# Patient Record
Sex: Male | Born: 1990
Health system: Southern US, Community
[De-identification: ages and names within clinical notes are randomized; demographics above are authoritative.]

---
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
## 2019-10-28 ENCOUNTER — Other Ambulatory Visit: Payer: Self-pay

## 2019-10-28 DIAGNOSIS — Z20822 Contact with and (suspected) exposure to covid-19: Secondary | ICD-10-CM

## 2019-10-30 LAB — NOVEL CORONAVIRUS, NAA: SARS-CoV-2, NAA: NOT DETECTED

## 2019-10-30 LAB — SARS-COV-2, NAA 2 DAY TAT

## 2021-09-18 ENCOUNTER — Other Ambulatory Visit: Payer: Self-pay

## 2021-09-18 ENCOUNTER — Emergency Department: Payer: BC Managed Care – PPO

## 2021-09-18 ENCOUNTER — Observation Stay
Admission: EM | Admit: 2021-09-18 | Discharge: 2021-09-20 | Disposition: A | Discharge status: Home / Self Care | Payer: BC Managed Care – PPO | Attending: General Surgery | Admitting: General Surgery

## 2021-09-18 DIAGNOSIS — K353 Acute appendicitis with localized peritonitis, without perforation or gangrene: Secondary | ICD-10-CM | POA: Diagnosis not present

## 2021-09-18 DIAGNOSIS — R1031 Right lower quadrant pain: Secondary | ICD-10-CM | POA: Diagnosis present

## 2021-09-18 DIAGNOSIS — K3580 Unspecified acute appendicitis: Secondary | ICD-10-CM

## 2021-09-18 LAB — CBC
HCT: 43.4 % (ref 39.0–52.0)
Hemoglobin: 14 g/dL (ref 13.0–17.0)
MCH: 26.8 pg (ref 26.0–34.0)
MCHC: 32.3 g/dL (ref 30.0–36.0)
MCV: 83.1 fL (ref 80.0–100.0)
Platelets: 222 10*3/uL (ref 150–400)
RBC: 5.22 MIL/uL (ref 4.22–5.81)
RDW: 13.1 % (ref 11.5–15.5)
WBC: 6.3 10*3/uL (ref 4.0–10.5)
nRBC: 0 % (ref 0.0–0.2)

## 2021-09-18 LAB — URINALYSIS, ROUTINE W REFLEX MICROSCOPIC
Bilirubin Urine: NEGATIVE
Glucose, UA: NEGATIVE mg/dL
Hgb urine dipstick: NEGATIVE
Ketones, ur: NEGATIVE mg/dL
Leukocytes,Ua: NEGATIVE
Nitrite: NEGATIVE
Protein, ur: NEGATIVE mg/dL
Specific Gravity, Urine: 1.025 (ref 1.005–1.030)
pH: 5 (ref 5.0–8.0)

## 2021-09-18 LAB — COMPREHENSIVE METABOLIC PANEL
ALT: 63 U/L — ABNORMAL HIGH (ref 0–44)
AST: 40 U/L (ref 15–41)
Albumin: 4 g/dL (ref 3.5–5.0)
Alkaline Phosphatase: 57 U/L (ref 38–126)
Anion gap: 7 (ref 5–15)
BUN: 11 mg/dL (ref 6–20)
CO2: 27 mmol/L (ref 22–32)
Calcium: 9.4 mg/dL (ref 8.9–10.3)
Chloride: 104 mmol/L (ref 98–111)
Creatinine, Ser: 1 mg/dL (ref 0.61–1.24)
GFR, Estimated: >60 mL/min (ref >60)
Glucose, Bld: 86 mg/dL (ref 70–99)
Potassium: 4.2 mmol/L (ref 3.5–5.1)
Sodium: 138 mmol/L (ref 135–145)
Total Bilirubin: 0.6 mg/dL (ref 0.3–1.2)
Total Protein: 8.4 g/dL — ABNORMAL HIGH (ref 6.5–8.1)

## 2021-09-18 LAB — LIPASE, BLOOD: Lipase: 37 U/L (ref 11–51)

## 2021-09-18 MED ORDER — SODIUM CHLORIDE 0.9 % IV SOLN: INTRAVENOUS | Status: DC

## 2021-09-18 MED ORDER — SODIUM CHLORIDE 0.9 % IV BOLUS: 1000.0000 mL | Freq: Once | INTRAVENOUS | Status: DC

## 2021-09-18 MED ORDER — KETOROLAC TROMETHAMINE 15 MG/ML IJ SOLN
15.0000 mg | Freq: Once | INTRAMUSCULAR | Status: AC
  Administered 2021-09-18: 15 mg via INTRAVENOUS
  Filled 2021-09-18: qty 1

## 2021-09-18 MED ORDER — ONDANSETRON 4 MG PO TBDP: 4.0000 mg | ORAL_TABLET | Freq: Four times a day (QID) | ORAL | Status: DC | PRN

## 2021-09-18 MED ORDER — ONDANSETRON HCL 4 MG/2ML IJ SOLN: 4.0000 mg | Freq: Four times a day (QID) | INTRAMUSCULAR | Status: DC | PRN

## 2021-09-18 MED ORDER — ACETAMINOPHEN 325 MG PO TABS: 650.0000 mg | ORAL_TABLET | Freq: Four times a day (QID) | ORAL | Status: DC | PRN

## 2021-09-18 MED ORDER — ACETAMINOPHEN 650 MG RE SUPP: 650.0000 mg | Freq: Four times a day (QID) | RECTAL | Status: DC | PRN

## 2021-09-18 MED ORDER — MORPHINE SULFATE (PF) 4 MG/ML IV SOLN
4.0000 mg | INTRAVENOUS | Status: DC | PRN
  Administered 2021-09-19 (×2): 4 mg via INTRAVENOUS
  Filled 2021-09-18 (×2): qty 1

## 2021-09-18 MED ORDER — IOHEXOL 350 MG/ML SOLN
100.0000 mL | Freq: Once | INTRAVENOUS | Status: AC | PRN
  Administered 2021-09-18: 100 mL via INTRAVENOUS

## 2021-09-18 MED ORDER — ENOXAPARIN SODIUM 40 MG/0.4ML IJ SOSY
40.0000 mg | PREFILLED_SYRINGE | INTRAMUSCULAR | Status: DC
  Administered 2021-09-19: 40 mg via SUBCUTANEOUS
  Filled 2021-09-18: qty 0.4

## 2021-09-18 MED ORDER — PIPERACILLIN-TAZOBACTAM 3.375 G IVPB 30 MIN
3.3750 g | Freq: Once | INTRAVENOUS | Status: DC
  Filled 2021-09-18: qty 50

## 2021-09-18 MED ORDER — PIPERACILLIN-TAZOBACTAM 3.375 G IVPB
3.3750 g | Freq: Three times a day (TID) | INTRAVENOUS | Status: DC
Start: 2021-09-18 — End: 2021-09-20
  Administered 2021-09-18 – 2021-09-20 (×5): 3.375 g via INTRAVENOUS
  Filled 2021-09-18 (×4): qty 50

## 2021-09-18 MED ORDER — HYDROCODONE-ACETAMINOPHEN 5-325 MG PO TABS
1.0000 | ORAL_TABLET | ORAL | Status: DC | PRN
  Administered 2021-09-19 – 2021-09-20 (×3): 2 via ORAL
  Filled 2021-09-18 (×3): qty 2

## 2021-09-18 NOTE — ED Provider Notes (Signed)
[facility] Provider Note    Event Date/Time   First MD Initiated Contact with Patient 09/18/21 1845     (approximate)   History   Abdominal Pain   HPI  Gary Pennington is a 31 y.o. male with no significant past medical history presents with right lower quadrant pain.  Symptoms woke him up from sleep around 2 AM today.  Pain is in the right lower quadrant.  Feels worse with movement improves when he stands still.  Has no associated nausea vomiting diarrhea or constipation but stools have been somewhat loose.  No fevers no history of similar denies testicular pain or urinary symptoms.  No prior abdominal surgeries.    History reviewed. No pertinent past medical history.  There are no problems to display for this patient.    Physical Exam  Triage Vital Signs: ED Triage Vitals  Enc Vitals Group     BP 09/18/21 1557 140/83     Pulse Rate 09/18/21 1557 95     Resp 09/18/21 1557 17     Temp 09/18/21 1557 98.6 F (37 C)     Temp Source 09/18/21 1557 Oral     SpO2 09/18/21 1557 99 %     Weight 09/18/21 1558 270 lb (122.5 kg)     Height 09/18/21 1558 5\' 8"  (1.727 m)     Head Circumference --      Peak Flow --      Pain Score 09/18/21 1558 7     Pain Loc --      Pain Edu? --      Excl. in GC? --     Most recent vital signs: Vitals:   09/18/21 1557 09/18/21 1846  BP: 140/83 138/80  Pulse: 95 90  Resp: 17 16  Temp: 98.6 F (37 C)   SpO2: 99% 99%     General: Awake, no distress.  CV:  Good peripheral perfusion.  Resp:  Normal effort.  Abd:  No distention.  Tenderness to palpation of right lower quadrant no guarding abdomen soft Neuro:             Awake, Alert, Oriented x 3  Other:     ED Results / Procedures / Treatments  Labs (all labs ordered are listed, but only abnormal results are displayed) Labs Reviewed  COMPREHENSIVE METABOLIC PANEL - Abnormal; Notable for the following components:      Result Value   Total Protein 8.4 (*)     ALT 63 (*)    All other components within normal limits  URINALYSIS, ROUTINE W REFLEX MICROSCOPIC - Abnormal; Notable for the following components:   Color, Urine YELLOW (*)    APPearance HAZY (*)    All other components within normal limits  LIPASE, BLOOD  CBC     EKG     RADIOLOGY    PROCEDURES:  Critical Care performed: No  Procedures   MEDICATIONS ORDERED IN ED: Medications  piperacillin-tazobactam (ZOSYN) IVPB 3.375 g (has no administration in time range)  sodium chloride 0.9 % bolus 1,000 mL (has no administration in time range)  ketorolac (TORADOL) 15 MG/ML injection 15 mg (15 mg Intravenous Given 09/18/21 1939)  iohexol (OMNIPAQUE) 350 MG/ML injection 100 mL (100 mLs Intravenous Contrast Given 09/18/21 2002)     IMPRESSION / MDM / ASSESSMENT AND PLAN / ED COURSE  I reviewed the triage vital signs and the nursing notes.  Patient's presentation is most consistent with acute presentation with potential threat to life or bodily function.  Differential diagnosis includes, but is not limited to, appendicitis, enteritis, epiploic appendagitis, kidney stone  The patient is a 31 year old male presenting with right lower quadrant pain x1 day.  Pain is worse with movement improved with resting.  Vitals are within normal limits and labs are reassuring including no elevated white blood cell count.  He is however tender in the right lower quadrant to palpation with otherwise benign abdominal exam.  Sinusitis is my top concern we will obtain a CT abdomen pelvis with contrast and give IV Toradol.   CT abdomen pelvis is consistent with acute appendicitis.  I spoken with Dr. Maia Plan with general surgery.  We will admit the hospitalist to the surgical service.  He is given Zosyn made n.p.o. and fluid bolus. FINAL CLINICAL IMPRESSION(S) / ED DIAGNOSES   Final diagnoses:  Acute appendicitis, unspecified acute appendicitis type     Rx / DC Orders    ED Discharge Orders     None        Note:  This document was prepared using Dragon voice recognition software and may include unintentional dictation errors.   Georga Hacking, MD 09/18/21 2039

## 2021-09-18 NOTE — ED Notes (Signed)
See triage note.  Presents with pain to RLQ  States pain started about 230 am  States pain woke him up  Denies any n/v/d/ or fever

## 2021-09-18 NOTE — ED Triage Notes (Signed)
Pt c/o RLQ pain that started this morning., denies N/V/D/fever

## 2021-09-19 ENCOUNTER — Other Ambulatory Visit: Payer: Self-pay

## 2021-09-19 ENCOUNTER — Observation Stay: Payer: BC Managed Care – PPO | Admitting: Anesthesiology

## 2021-09-19 ENCOUNTER — Encounter: Payer: Self-pay | Admitting: General Surgery

## 2021-09-19 ENCOUNTER — Encounter
Admission: EM | Disposition: A | Discharge status: Home / Self Care | Payer: Self-pay | Source: Home / Self Care | Attending: Emergency Medicine

## 2021-09-19 HISTORY — PX: XI ROBOTIC LAPAROSCOPIC ASSISTED APPENDECTOMY: SHX6877

## 2021-09-19 SURGERY — APPENDECTOMY, ROBOT-ASSISTED, LAPAROSCOPIC
Anesthesia: General | Site: Abdomen

## 2021-09-19 MED ORDER — OXYCODONE HCL 5 MG/5ML PO SOLN: 5.0000 mg | Freq: Once | ORAL | Status: DC | PRN

## 2021-09-19 MED ORDER — CEFAZOLIN SODIUM-DEXTROSE 2-3 GM-%(50ML) IV SOLR
INTRAVENOUS | Status: DC | PRN
  Administered 2021-09-19: 2 g via INTRAVENOUS

## 2021-09-19 MED ORDER — BUPIVACAINE-EPINEPHRINE (PF) 0.5% -1:200000 IJ SOLN
INTRAMUSCULAR | Status: DC | PRN
  Administered 2021-09-19: 30 mL

## 2021-09-19 MED ORDER — MIDAZOLAM HCL 2 MG/2ML IJ SOLN
INTRAMUSCULAR | Status: DC | PRN
  Administered 2021-09-19: 2 mg via INTRAVENOUS

## 2021-09-19 MED ORDER — DEXAMETHASONE SODIUM PHOSPHATE 10 MG/ML IJ SOLN
INTRAMUSCULAR | Status: AC
  Filled 2021-09-19: qty 1

## 2021-09-19 MED ORDER — FENTANYL CITRATE (PF) 100 MCG/2ML IJ SOLN
INTRAMUSCULAR | Status: AC
  Filled 2021-09-19: qty 2

## 2021-09-19 MED ORDER — ONDANSETRON HCL 4 MG/2ML IJ SOLN
INTRAMUSCULAR | Status: AC
  Filled 2021-09-19: qty 2

## 2021-09-19 MED ORDER — SUGAMMADEX SODIUM 500 MG/5ML IV SOLN
INTRAVENOUS | Status: AC
  Filled 2021-09-19: qty 5

## 2021-09-19 MED ORDER — OXYCODONE HCL 5 MG PO TABS: 5.0000 mg | ORAL_TABLET | Freq: Once | ORAL | Status: DC | PRN

## 2021-09-19 MED ORDER — SUGAMMADEX SODIUM 500 MG/5ML IV SOLN
INTRAVENOUS | Status: DC | PRN
  Administered 2021-09-19: 300 mg via INTRAVENOUS

## 2021-09-19 MED ORDER — BUPIVACAINE-EPINEPHRINE (PF) 0.5% -1:200000 IJ SOLN
INTRAMUSCULAR | Status: AC
  Filled 2021-09-19: qty 30

## 2021-09-19 MED ORDER — 0.9 % SODIUM CHLORIDE (POUR BTL) OPTIME
TOPICAL | Status: DC | PRN
  Administered 2021-09-19: 500 mL

## 2021-09-19 MED ORDER — DEXAMETHASONE SODIUM PHOSPHATE 10 MG/ML IJ SOLN
INTRAMUSCULAR | Status: DC | PRN
  Administered 2021-09-19: 10 mg via INTRAVENOUS

## 2021-09-19 MED ORDER — PROPOFOL 10 MG/ML IV BOLUS
INTRAVENOUS | Status: DC | PRN
  Administered 2021-09-19: 200 mg via INTRAVENOUS

## 2021-09-19 MED ORDER — MIDAZOLAM HCL 2 MG/2ML IJ SOLN
INTRAMUSCULAR | Status: AC
  Filled 2021-09-19: qty 2

## 2021-09-19 MED ORDER — IBUPROFEN 100 MG/5ML PO SUSP
600.0000 mg | Freq: Three times a day (TID) | ORAL | Status: DC | PRN
  Administered 2021-09-20: 600 mg via ORAL
  Filled 2021-09-19: qty 20
  Filled 2021-09-19 (×2): qty 30

## 2021-09-19 MED ORDER — FENTANYL CITRATE (PF) 100 MCG/2ML IJ SOLN: 25.0000 ug | INTRAMUSCULAR | Status: DC | PRN

## 2021-09-19 MED ORDER — CEFAZOLIN SODIUM 1 G IJ SOLR
INTRAMUSCULAR | Status: AC
  Filled 2021-09-19: qty 20

## 2021-09-19 MED ORDER — LIDOCAINE HCL (PF) 2 % IJ SOLN
INTRAMUSCULAR | Status: AC
  Filled 2021-09-19: qty 5

## 2021-09-19 MED ORDER — ROCURONIUM BROMIDE 100 MG/10ML IV SOLN
INTRAVENOUS | Status: DC | PRN
  Administered 2021-09-19: 60 mg via INTRAVENOUS
  Administered 2021-09-19: 5 mg via INTRAVENOUS

## 2021-09-19 MED ORDER — PROPOFOL 10 MG/ML IV BOLUS
INTRAVENOUS | Status: AC
  Filled 2021-09-19: qty 20

## 2021-09-19 MED ORDER — ACETAMINOPHEN 10 MG/ML IV SOLN
INTRAVENOUS | Status: DC | PRN
  Administered 2021-09-19: 1000 mg via INTRAVENOUS

## 2021-09-19 MED ORDER — LACTATED RINGERS IV SOLN: INTRAVENOUS | Status: DC | PRN

## 2021-09-19 MED ORDER — LIDOCAINE HCL (CARDIAC) PF 100 MG/5ML IV SOSY
PREFILLED_SYRINGE | INTRAVENOUS | Status: DC | PRN
  Administered 2021-09-19: 100 mg via INTRAVENOUS

## 2021-09-19 MED ORDER — ACETAMINOPHEN 10 MG/ML IV SOLN
INTRAVENOUS | Status: AC
  Filled 2021-09-19: qty 100

## 2021-09-19 MED ORDER — ROCURONIUM BROMIDE 10 MG/ML (PF) SYRINGE
PREFILLED_SYRINGE | INTRAVENOUS | Status: AC
  Filled 2021-09-19: qty 10

## 2021-09-19 MED ORDER — ONDANSETRON HCL 4 MG/2ML IJ SOLN
INTRAMUSCULAR | Status: DC | PRN
  Administered 2021-09-19: 4 mg via INTRAVENOUS

## 2021-09-19 MED ORDER — FENTANYL CITRATE (PF) 100 MCG/2ML IJ SOLN
INTRAMUSCULAR | Status: DC | PRN
  Administered 2021-09-19: 100 ug via INTRAVENOUS

## 2021-09-19 SURGICAL SUPPLY — 62 items
BAG PRESSURE INF REUSE 1000 (BAG) IMPLANT
BLADE SURG SZ11 CARB STEEL (BLADE) ×2 IMPLANT
CANNULA REDUC XI 12-8 STAPL (CANNULA) ×1
CANNULA REDUCER 12-8 DVNC XI (CANNULA) ×1 IMPLANT
COVER TIP SHEARS 8 DVNC (MISCELLANEOUS) ×1 IMPLANT
COVER TIP SHEARS 8MM DA VINCI (MISCELLANEOUS) ×1
DERMABOND ADVANCED (GAUZE/BANDAGES/DRESSINGS) ×1
DERMABOND ADVANCED .7 DNX12 (GAUZE/BANDAGES/DRESSINGS) ×1 IMPLANT
DRAPE ARM DVNC X/XI (DISPOSABLE) ×4 IMPLANT
DRAPE COLUMN DVNC XI (DISPOSABLE) ×1 IMPLANT
DRAPE DA VINCI XI ARM (DISPOSABLE) ×4
DRAPE DA VINCI XI COLUMN (DISPOSABLE) ×1
ELECT REM PT RETURN 9FT ADLT (ELECTROSURGICAL) ×2
ELECTRODE REM PT RTRN 9FT ADLT (ELECTROSURGICAL) ×1 IMPLANT
GLOVE BIOGEL PI IND STRL 6.5 (GLOVE) ×2 IMPLANT
GLOVE BIOGEL PI INDICATOR 6.5 (GLOVE) ×2
GLOVE SURG SYN 6.5 ES PF (GLOVE) ×4 IMPLANT
GLOVE SURG SYN 6.5 PF PI (GLOVE) ×2 IMPLANT
GOWN STRL REUS W/ TWL LRG LVL3 (GOWN DISPOSABLE) ×3 IMPLANT
GOWN STRL REUS W/TWL LRG LVL3 (GOWN DISPOSABLE) ×3
GRASPER SUT TROCAR 14GX15 (MISCELLANEOUS) IMPLANT
IRRIGATOR SUCT 8 DISP DVNC XI (IRRIGATION / IRRIGATOR) IMPLANT
IRRIGATOR SUCTION 8MM XI DISP (IRRIGATION / IRRIGATOR)
KIT PINK PAD W/HEAD ARE REST (MISCELLANEOUS) ×2 IMPLANT
KIT PINK PAD W/HEAD ARM REST (MISCELLANEOUS) ×1 IMPLANT
LABEL OR SOLS (LABEL) IMPLANT
MANIFOLD NEPTUNE II (INSTRUMENTS) ×2 IMPLANT
NDL INSUFFLATION 14GA 120MM (NEEDLE) ×1 IMPLANT
NEEDLE HYPO 22GX1.5 SAFETY (NEEDLE) ×2 IMPLANT
NEEDLE INSUFFLATION 14GA 120MM (NEEDLE) ×2 IMPLANT
OBTURATOR OPTICAL STANDARD 8MM (TROCAR) ×1
OBTURATOR OPTICAL STND 8 DVNC (TROCAR) ×1
OBTURATOR OPTICALSTD 8 DVNC (TROCAR) ×1 IMPLANT
PACK LAP CHOLECYSTECTOMY (MISCELLANEOUS) ×2 IMPLANT
RELOAD STAPLE 45 2.5 WHT DVNC (STAPLE) IMPLANT
RELOAD STAPLE 45 3.5 BLU DVNC (STAPLE) ×1 IMPLANT
RELOAD STAPLER 2.5X45 WHT DVNC (STAPLE) IMPLANT
RELOAD STAPLER 3.5X45 BLU DVNC (STAPLE) IMPLANT
SEAL CANN UNIV 5-8 DVNC XI (MISCELLANEOUS) ×3 IMPLANT
SEAL XI 5MM-8MM UNIVERSAL (MISCELLANEOUS) ×3
SEALER VESSEL DA VINCI XI (MISCELLANEOUS)
SEALER VESSEL EXT DVNC XI (MISCELLANEOUS) IMPLANT
SET TUBE SMOKE EVAC HIGH FLOW (TUBING) ×2 IMPLANT
SOLUTION ELECTROLUBE (MISCELLANEOUS) ×2 IMPLANT
SPONGE T-LAP 4X18 ~~LOC~~+RFID (SPONGE) ×2 IMPLANT
STAPLER 45 DA VINCI SURE FORM (STAPLE)
STAPLER 45 SUREFORM DVNC (STAPLE) ×1 IMPLANT
STAPLER CANNULA SEAL DVNC XI (STAPLE) ×1 IMPLANT
STAPLER CANNULA SEAL XI (STAPLE) ×1
STAPLER RELOAD 2.5X45 WHITE (STAPLE)
STAPLER RELOAD 2.5X45 WHT DVNC (STAPLE)
STAPLER RELOAD 3.5X45 BLU DVNC (STAPLE)
STAPLER RELOAD 3.5X45 BLUE (STAPLE)
SUT MNCRL AB 4-0 PS2 18 (SUTURE) ×3 IMPLANT
SUT VIC AB 2-0 SH 27 (SUTURE)
SUT VIC AB 2-0 SH 27XBRD (SUTURE) ×1 IMPLANT
SUT VICRYL 0 AB UR-6 (SUTURE) ×2 IMPLANT
SUT VLOC 90 6 CV-15 VIOLET (SUTURE) ×2 IMPLANT
SYR 30ML LL (SYRINGE) ×2 IMPLANT
SYS BAG RETRIEVAL 10MM (BASKET) ×2
SYSTEM BAG RETRIEVAL 10MM (BASKET) ×1 IMPLANT
TRAY FOLEY MTR SLVR 16FR STAT (SET/KITS/TRAYS/PACK) IMPLANT

## 2021-09-19 NOTE — Transfer of Care (Signed)
Immediate Anesthesia Transfer of Care Note  Patient: Gary Pennington  Procedure(s) Performed: XI ROBOTIC LAPAROSCOPIC ASSISTED APPENDECTOMY (Abdomen)  Patient Location: PACU  Anesthesia Type:General  Level of Consciousness: awake and drowsy  Airway & Oxygen Therapy: Patient Spontanous Breathing and Patient connected to face mask oxygen  Post-op Assessment: Report given to RN and Post -op Vital signs reviewed and stable  Post vital signs: Reviewed and stable  Last Vitals:  Vitals Value Taken Time  BP 121/58 09/19/21 1324  Temp 36.2 C 09/19/21 1324  Pulse 88 09/19/21 1326  Resp 24 09/19/21 1326  SpO2 100 % 09/19/21 1326  Vitals shown include unvalidated device data.  Last Pain:  Vitals:   09/19/21 1123  TempSrc: Temporal  PainSc: 0-No pain      Patients Stated Pain Goal: 0 (09/19/21 1123)  Complications:  Encounter Notable Events  Notable Event Outcome Phase Comment  Difficult to intubate - expected  Intraprocedure Filed from anesthesia note documentation.

## 2021-09-19 NOTE — TOC Initial Note (Signed)
Transition of Care [facility]) - Initial/Assessment Note    Patient Details  Name: Gary Pennington MRN: [id number] Date of Birth: 05/29/1990  Transition of Care [facility]) CM/SW Contact:    Chapman Fitch, RN Phone Number: 09/19/2021, 4:28 PM  Clinical Narrative:                  Transition of Care (TOC) Screening Note   Patient Details  Name: Gary Pennington Date of Birth: 1991/02/11   Transition of Care [facility]) CM/SW Contact:    Chapman Fitch, RN Phone Number: 09/19/2021, 4:28 PM    Transition of Care Department [facility]) has reviewed patient and no TOC needs have been identified at this time. We will continue to monitor patient advancement through interdisciplinary progression rounds. If new patient transition needs arise, please place a TOC consult.          Patient Goals and CMS Choice        Expected Discharge Plan and Services                                                Prior Living Arrangements/Services                       Activities of Daily Living Home Assistive Devices/Equipment: None ADL Screening (condition at time of admission) Patient's cognitive ability adequate to safely complete daily activities?: Yes Is the patient deaf or have difficulty hearing?: No Does the patient have difficulty seeing, even when wearing glasses/contacts?: No Does the patient have difficulty concentrating, remembering, or making decisions?: No Patient able to express need for assistance with ADLs?: Yes Does the patient have difficulty dressing or bathing?: No Independently performs ADLs?: Yes (appropriate for developmental age) Does the patient have difficulty walking or climbing stairs?: No Weakness of Legs: None Weakness of Arms/Hands: None  Permission Sought/Granted                  Emotional Assessment              Admission diagnosis:  Acute appendicitis with localized peritonitis [K35.30] Acute appendicitis, unspecified acute  appendicitis type [K35.80] Patient Active Problem List   Diagnosis Date Noted   Acute appendicitis with localized peritonitis 09/18/2021   PCP:  Kandyce Rud, MD Pharmacy:   [facility] [address] [place] - [address] [place] [address] Phone: [phone] Fax: [phone]     Social Determinants of Health (SDOH) Interventions    Readmission Risk Interventions     No data to display

## 2021-09-19 NOTE — H&P (Signed)
SURGICAL CONSULTATION NOTE   HISTORY OF PRESENT ILLNESS (HPI):  31 y.o. male presented to [facility] [place] ED for evaluation of abdominal pain. Patient reports he started having abdominal pain since yesterday 2 AM.  Pain localized to the right lower quadrant.  Pain does not radiate to the part of body.  Pain is aggravated by applying pressure and moving the abdominal wall.  Alleviating factor has been pain medication and antibiotics at the ED.  He endorses nausea and vomiting.  At the ED he was found with normal white blood cell count.  Normal hemoglobin.  There was tenderness to palpation in the right lower quadrant.  CT scan of the abdomen pelvis shows uncomplicated appendicitis.  There is no free air or free fluid.  I personally evaluated the images.  Surgery is consulted by Dr. Sidney Ace in this context for evaluation and management of acute appendicitis with.  PAST MEDICAL HISTORY (PMH):  History reviewed. No pertinent past medical history.   PAST SURGICAL HISTORY (PSH):  History reviewed. No pertinent surgical history.   MEDICATIONS:  Prior to Admission medications   Not on File     ALLERGIES:  No Known Allergies   SOCIAL HISTORY:  Social History   Socioeconomic History   Marital status: Single    Spouse name: Not on file   Number of children: Not on file   Years of education: Not on file   Highest education level: Not on file  Occupational History   Not on file  Tobacco Use   Smoking status: Never   Smokeless tobacco: Never  Substance and Sexual Activity   Alcohol use: Not Currently   Drug use: Not Currently   Sexual activity: Not on file  Other Topics Concern   Not on file  Social History Narrative   Not on file   Social Determinants of Health   Financial Resource Strain: Not on file  Food Insecurity: Not on file  Transportation Needs: Not on file  Physical Activity: Not on file  Stress: Not on file  Social Connections: Not on file  Intimate Partner Violence: Not on  file      FAMILY HISTORY:  No family history on file.   REVIEW OF SYSTEMS:  Constitutional: denies weight loss, fever, chills, or sweats  Eyes: denies any other vision changes, history of eye injury  ENT: denies sore throat, hearing problems  Respiratory: denies shortness of breath, wheezing  Cardiovascular: denies chest pain, palpitations  Gastrointestinal: positive abdominal pain, nausea and vomiting Genitourinary: denies burning with urination or urinary frequency Musculoskeletal: denies any other joint pains or cramps  Skin: denies any other rashes or skin discolorations  Neurological: denies any other headache, dizziness, weakness  Psychiatric: denies any other depression, anxiety   All other review of systems were negative   VITAL SIGNS:  Temp:  [98.6 F (37 C)-98.9 F (37.2 C)] 98.9 F (37.2 C) (07/18 0312) Pulse Rate:  [88-95] 90 (07/18 0312) Resp:  [16-20] 18 (07/18 0312) BP: (125-140)/(80-84) 132/84 (07/18 0312) SpO2:  [96 %-99 %] 97 % (07/18 0312) Weight:  [122.5 kg] 122.5 kg (07/17 1558)     Height: 5\' 8"  (172.7 cm) Weight: 122.5 kg BMI (Calculated): 41.06   INTAKE/OUTPUT:  This shift: No intake/output data recorded.  Last 2 shifts: @IOLAST2SHIFTS @   PHYSICAL EXAM:  Constitutional:  -- Normal body habitus  -- Awake, alert, and oriented x3  Eyes:  -- Pupils equally round and reactive to light  -- No scleral icterus  Ear, nose, and  throat:  -- No jugular venous distension  Pulmonary:  -- No crackles  -- Equal breath sounds bilaterally -- Breathing non-labored at rest Cardiovascular:  -- S1, S2 present  -- No pericardial rubs Gastrointestinal:  -- Abdomen soft, tender in the right lower quadrant, non-distended, no guarding or rebound tenderness -- No abdominal masses appreciated, pulsatile or otherwise  Musculoskeletal and Integumentary:  -- Wounds or skin discoloration: None appreciated -- Extremities: B/L UE and LE FROM, hands and feet warm, no  edema  Neurologic:  -- Motor function: intact and symmetric -- Sensation: intact and symmetric   Labs:     Latest Ref Rng & Units 09/18/2021    3:59 PM  CBC  WBC 4.0 - 10.5 K/uL 6.3   Hemoglobin 13.0 - 17.0 g/dL 14.0   Hematocrit 39.0 - 52.0 % 43.4   Platelets 150 - 400 K/uL 222       Latest Ref Rng & Units 09/18/2021    3:59 PM  CMP  Glucose 70 - 99 mg/dL 86   BUN 6 - 20 mg/dL 11   Creatinine 0.61 - 1.24 mg/dL 1.00   Sodium 135 - 145 mmol/L 138   Potassium 3.5 - 5.1 mmol/L 4.2   Chloride 98 - 111 mmol/L 104   CO2 22 - 32 mmol/L 27   Calcium 8.9 - 10.3 mg/dL 9.4   Total Protein 6.5 - 8.1 g/dL 8.4   Total Bilirubin 0.3 - 1.2 mg/dL 0.6   Alkaline Phos 38 - 126 U/L 57   AST 15 - 41 U/L 40   ALT 0 - 44 U/L 63      Imaging studies:  EXAM: CT ABDOMEN AND PELVIS WITH CONTRAST  TECHNIQUE: Multidetector CT imaging of the abdomen and pelvis was performed using the standard protocol following bolus administration of intravenous contrast.  RADIATION DOSE REDUCTION: This exam was performed according to the departmental dose-optimization program which includes automated exposure control, adjustment of the mA and/or kV according to patient size and/or use of iterative reconstruction technique.  CONTRAST: 123mL OMNIPAQUE IOHEXOL 350 MG/ML SOLN  COMPARISON: None Available.  FINDINGS: Lower chest: Occasional atelectasis. No pleural effusion.  Hepatobiliary: The liver is enlarged spanning 20 cm cranial caudal with diffuse hepatic steatosis. No focal hepatic lesion. Gallbladder physiologically distended, no calcified stone. No biliary dilatation.  Pancreas: Unremarkable. No pancreatic ductal dilatation or surrounding inflammatory changes.  Spleen: Enlarged measuring 16.8 cm AP dimension. Small cleft posteriorly, no focal lesion.  Adrenals/Urinary Tract: Normal adrenal glands. No hydronephrosis or perinephric edema. Homogeneous renal enhancement. Two  punctate nonobstructing stones in the lower left kidney. Urinary bladder is partially distended without wall thickening.  Stomach/Bowel: Acute appendicitis. The appendix is dilated measuring 11 mm, series 2, image 73. There is wall thickening and periappendiceal fat stranding no appendicolith. No perforation or abscess. The stomach, small and large bowel are unremarkable.  Vascular/Lymphatic: Normal caliber abdominal aorta. Retroaortic left renal vein. Reactive appearing 10 mm portal caval node series 2, image 31. There prominent ileocolic lymph nodes are likely reactive. Shotty bilateral external iliac nodes measuring 10 mm.  Reproductive: Prostate is unremarkable.  Other: No free air, free fluid, or intra-abdominal fluid collection. Diminutive fat containing umbilical hernia  Musculoskeletal: There are no acute or suspicious osseous abnormalities.  IMPRESSION: 1. Uncomplicated acute appendicitis. 2. Hepatosplenomegaly and hepatic steatosis. 3. Nonobstructing left nephrolithiasis.   Electronically Signed By: Keith Rake M.D. On: 09/18/2021 20:18   Assessment/Plan:  31 y.o. male with acute appendicitis.  Patient with history, physical  exam and images consistent with acute appendicitis. Patient oriented about diagnosis and surgical management as treatment. Patient oriented about goals of surgery and its risk including: bowel injury, infection, abscess, bleeding, leak from cecum, intestinal adhesions, bowel obstruction, fistula, injury to the ureter among others.  Patient understood and agreed to proceed with surgery. Will admit patient, already started on antibiotic therapy, will give IV hydration since patient is NPO and schedule to OR.   Gae Gallop, MD

## 2021-09-19 NOTE — Anesthesia Postprocedure Evaluation (Signed)
Anesthesia Post Note  Patient: Brendin P Kindig  Procedure(s) Performed: XI ROBOTIC LAPAROSCOPIC ASSISTED APPENDECTOMY (Abdomen)  Patient location during evaluation: PACU Anesthesia Type: General Level of consciousness: awake and alert Pain management: pain level controlled Vital Signs Assessment: post-procedure vital signs reviewed and stable Respiratory status: spontaneous breathing, nonlabored ventilation, respiratory function stable and patient connected to nasal cannula oxygen Cardiovascular status: blood pressure returned to baseline and stable Postop Assessment: no apparent nausea or vomiting Anesthetic complications: yes   Encounter Notable Events  Notable Event Outcome Phase Comment  Difficult to intubate - expected  Intraprocedure Filed from anesthesia note documentation.     Last Vitals:  Vitals:   09/19/21 1324 09/19/21 1330  BP: (!) 121/58 122/62  Pulse: 94 93  Resp: 17 18  Temp: (!) 36.2 C   SpO2: 97% 90%    Last Pain:  Vitals:   09/19/21 1324  TempSrc:   PainSc: Asleep                 Stephanie Coup

## 2021-09-19 NOTE — Op Note (Signed)
Pre-op Diagnosis: Acute appendicitis   Post op Diagnosis: Acute appenditicis  Procedure: Robotic assisted laparoscopic appendectomy.  Anesthesia: GETA  Surgeon: Carolan Shiver, MD, FACS  Wound Classification: clean contaminated  Specimen: Appendix  Complications: None  Estimated Blood Loss: 3 mL   Indications: Patient is a 31 y.o. male  presented with above right lower quadrant pain. CT scan shows acute appendicitis.     FIndings: 1. Inflamed dilated appendix 2. No peri-appendiceal abscess or phlegmon 3. Normal anatomy 4. Adequate hemostasis.   Description of procedure: The patient was placed on the operating table in the supine position. General anesthesia was induced. A time-out was completed verifying correct patient, procedure, site, positioning, and implant(s) and/or special equipment prior to beginning this procedure. The abdomen was prepped and draped in the usual sterile fashion.   Palmer's point located and Veress needle was inserted.  After confirming 2 clicks and a positive saline drop test, gas insufflation was initiated until the abdominal pressure was measured at 15 mmHg.  Afterwards, the Veress needle was removed and a 8 mm port was placed in left upper quadrant area using Optiview technique.  After local was infused, 3 additional incision on the left abdominal wall were made 5 cm apart.  An 12 mm port and two other 8 mm ports were placed under direct visualization.  No injuries from trocar placements were noted.  The table was placed in the Trendelenburg position with the right side elevated.  With the use of Tip up grasper, fenestrated bipolar and monopolar scissors, an inflamed appendix was identified and elevated.  Window created at base of appendix in the mesentery.    The mesoappendix was divided with combination of bipolar energy and monopolar scissors.  The base of the appendix was ligated with 3-0 V-Loc.  The appendix was divided with monopolar scissors.   A second layer of the 3 oh V-Loc was done over the appendiceal stump.  The appendiceal stump was examined and hemostasis noted. No other pathology was identified within pelvis. The 12 mm trocar removed and port site closed with PMI using 0 vicryl under direct vision. Remaining trocars were removed under direct vision. No bleeding was noted.The abdomen was allowed to collapse.  All skin incisions then closed with subcuticular sutures Monocryl 4-0.  Wounds then dressed with dermabond.  The patient tolerated the procedure well, awakened from anesthesia and was taken to the postanesthesia care unit in satisfactory condition.  Sponge count and instrument count correct at the end of the procedure.

## 2021-09-19 NOTE — Anesthesia Preprocedure Evaluation (Signed)
Anesthesia Evaluation  Patient identified by MRN, date of birth, ID band Patient awake    Reviewed: Allergy & Precautions, NPO status , Patient's Chart, lab work & pertinent test results  Airway Mallampati: III  TM Distance: >3 FB Neck ROM: full    Dental  (+) Teeth Intact   Pulmonary neg pulmonary ROS,    Pulmonary exam normal        Cardiovascular negative cardio ROS Normal cardiovascular exam     Neuro/Psych negative neurological ROS  negative psych ROS   GI/Hepatic negative GI ROS, Neg liver ROS,   Endo/Other  negative endocrine ROS  Renal/GU      Musculoskeletal   Abdominal   Peds  Hematology negative hematology ROS (+)   Anesthesia Other Findings History reviewed. No pertinent past medical history.  History reviewed. No pertinent surgical history.  BMI    Body Mass Index: 41.05 kg/m      Reproductive/Obstetrics negative OB ROS                             Anesthesia Physical Anesthesia Plan  ASA: 3 and emergent  Anesthesia Plan: General ETT   Post-op Pain Management:    Induction: Intravenous  PONV Risk Score and Plan: 2 and Ondansetron, Dexamethasone, Midazolam and Treatment may vary due to age or medical condition  Airway Management Planned: Oral ETT  Additional Equipment:   Intra-op Plan:   Post-operative Plan: Extubation in OR  Informed Consent: I have reviewed the patients History and Physical, chart, labs and discussed the procedure including the risks, benefits and alternatives for the proposed anesthesia with the patient or authorized representative who has indicated his/her understanding and acceptance.     Dental Advisory Given  Plan Discussed with: Anesthesiologist, CRNA and Surgeon  Anesthesia Plan Comments: (Patient consented for risks of anesthesia including but not limited to:  - adverse reactions to medications - damage to eyes, teeth, lips  or other oral mucosa - nerve damage due to positioning  - sore throat or hoarseness - Damage to heart, brain, nerves, lungs, other parts of body or loss of life  Patient voiced understanding.)        Anesthesia Quick Evaluation

## 2021-09-19 NOTE — Anesthesia Procedure Notes (Signed)
Procedure Name: Intubation Date/Time: 09/19/2021 12:13 PM  Performed by: Ginger Carne, CRNAPre-anesthesia Checklist: Patient identified, Emergency Drugs available, Suction available, Patient being monitored and Timeout performed Patient Re-evaluated:Patient Re-evaluated prior to induction Oxygen Delivery Method: Circle system utilized Preoxygenation: Pre-oxygenation with 100% oxygen Induction Type: IV induction Ventilation: Two handed mask ventilation required and Oral airway inserted - appropriate to patient size Laryngoscope Size: McGraph and 3 Grade View: Grade II Tube type: Oral Tube size: 7.5 mm Number of attempts: 1 Airway Equipment and Method: Stylet and Video-laryngoscopy Placement Confirmation: ETT inserted through vocal cords under direct vision, positive ETCO2 and breath sounds checked- equal and bilateral Secured at: 20 cm Tube secured with: Tape Dental Injury: Teeth and Oropharynx as per pre-operative assessment  Difficulty Due To: Difficulty was anticipated, Difficult Airway- due to limited oral opening, Difficult Airway- due to large tongue and Difficult Airway- due to reduced neck mobility Future Recommendations: Recommend- induction with short-acting agent, and alternative techniques readily available

## 2021-09-20 ENCOUNTER — Encounter: Payer: Self-pay | Admitting: General Surgery

## 2021-09-20 ENCOUNTER — Telehealth: Payer: Self-pay | Admitting: Infectious Diseases

## 2021-09-20 LAB — HIV-1/2 AB - DIFFERENTIATION
HIV 1 Ab: REACTIVE
HIV 2 Ab: NONREACTIVE

## 2021-09-20 LAB — HIV-1 RNA QUANT-NO REFLEX-BLD
HIV 1 RNA Quant: 285000 copies/mL
LOG10 HIV-1 RNA: 5.455 log10copy/mL

## 2021-09-20 LAB — HIV ANTIBODY (ROUTINE TESTING W REFLEX): HIV Screen 4th Generation wRfx: REACTIVE — AB

## 2021-09-20 LAB — SURGICAL PATHOLOGY

## 2021-09-20 MED ORDER — IBUPROFEN 100 MG/5ML PO SUSP: 600.0000 mg | Freq: Three times a day (TID) | ORAL | 0 refills | Status: DC | PRN

## 2021-09-20 NOTE — Discharge Summary (Signed)
  Patient ID: Gary Pennington MRN: [id number] DOB/AGE: 02-Nov-1990 30 y.o.  Admit date: 09/18/2021 Discharge date: 09/20/2021   Discharge Diagnoses:  Principal Problem:   Acute appendicitis with localized peritonitis   Procedures: Robotic assisted laparoscopic appendectomy  Hospital Course: Patient with acute appendicitis.  He underwent robotic appendectomy.  He has been recovering adequately.  Pain controlled.  Patient tolerating diet.  Wounds are dry and clean.  Physical Exam Vitals reviewed.  HENT:     Head: Normocephalic.  Cardiovascular:     Rate and Rhythm: Normal rate and regular rhythm.  Pulmonary:     Effort: Pulmonary effort is normal.     Breath sounds: Normal breath sounds.  Abdominal:     General: Abdomen is flat and scaphoid. Bowel sounds are normal.     Palpations: Abdomen is soft.  Skin:    Capillary Refill: Capillary refill takes less than 2 seconds.  Neurological:     Mental Status: He is alert and oriented to person, place, and time.      Consults: None  Disposition: Discharge disposition: 01-Home or Self Care       Discharge Instructions     Diet - low sodium heart healthy   Complete by: As directed    Increase activity slowly   Complete by: As directed       Allergies as of 09/20/2021   No Known Allergies      Medication List     TAKE these medications    ibuprofen 100 MG/5ML suspension Commonly known as: ADVIL Take 30 mLs (600 mg total) by mouth every 8 (eight) hours as needed for moderate pain.        Follow-up Information     Carolan Shiver, MD Follow up in 2 week(s).   Specialty: General Surgery Contact information: [address] [place] [address] [phone]

## 2021-09-20 NOTE — Telephone Encounter (Signed)
HIV confirmatory test came positive RNA still pending- pt left the hospital before the test resulted - I did not see him when he was admitted  Called and introduced myself after confirming it ws him --  told him that I needed to discuss a test result. He will come tomorrow around noon to my office .gave him the phone number and address

## 2021-09-20 NOTE — Progress Notes (Signed)
AVS reviewed with pt who verbalized understanding of all instructions/education. LDA removal complete. Pt dressed independently and awaiting wife's arrival for d/c.

## 2021-09-20 NOTE — Progress Notes (Signed)
At 1932, patient called wanting bathroom assistance, cleared pathway for patient but patient was not moving on bed and wanting to be pulled by this nurse, patient educated that he needs to be moving as tolerated post surgery and that there is no reason for him to be pulled up from bed and for this nurse to move his legs. Instructed to dangle legs a bedside prior to standing and  taught proper body mechanics in moving out of the bed, of note,  patient is independent and without any weakness on  extremities pre and post operation. Educated that ambulation will help prevent blood clots and colic formation.

## 2021-09-20 NOTE — Discharge Instructions (Signed)
Diet: Resume home heart healthy regular diet.  ° °Activity: No heavy lifting >20 pounds (children, pets, laundry, garbage) or strenuous activity until follow-up, but light activity and walking are encouraged. Do not drive or drink alcohol if taking narcotic pain medications. ° °Wound care: May shower with soapy water and pat dry (do not rub incisions), but no baths or submerging incision underwater until follow-up. (no swimming)  ° °Medications: Resume all home medications. For mild to moderate pain: acetaminophen (Tylenol) or ibuprofen (if no kidney disease). Combining Tylenol with alcohol can substantially increase your risk of causing liver disease.  ° °Call office ([phone]) at any time if any questions, worsening pain, fevers/chills, bleeding, drainage from incision site, or other concerns. ° °

## 2021-09-20 NOTE — Plan of Care (Signed)
  Problem: Clinical Measurements: Goal: Will remain free from infection Outcome: Not Progressing   Problem: Activity: Goal: Risk for activity intolerance will decrease Outcome: Not Progressing   Problem: Elimination: Goal: Will not experience complications related to bowel motility Outcome: Not Progressing   Problem: Pain Managment: Goal: General experience of comfort will improve Outcome: Not Progressing   

## 2021-09-21 ENCOUNTER — Other Ambulatory Visit (HOSPITAL_COMMUNITY): Payer: Self-pay

## 2021-09-21 ENCOUNTER — Telehealth (HOSPITAL_COMMUNITY): Payer: Self-pay | Admitting: Pharmacy Technician

## 2021-09-21 ENCOUNTER — Other Ambulatory Visit: Payer: Self-pay | Admitting: Infectious Diseases

## 2021-09-21 ENCOUNTER — Ambulatory Visit: Payer: BC Managed Care – PPO | Attending: Infectious Diseases | Admitting: Infectious Diseases

## 2021-09-21 DIAGNOSIS — Z21 Asymptomatic human immunodeficiency virus [HIV] infection status: Secondary | ICD-10-CM | POA: Insufficient documentation

## 2021-09-21 DIAGNOSIS — Z9889 Other specified postprocedural states: Secondary | ICD-10-CM | POA: Insufficient documentation

## 2021-09-21 DIAGNOSIS — B20 Human immunodeficiency virus [HIV] disease: Secondary | ICD-10-CM | POA: Diagnosis not present

## 2021-09-21 DIAGNOSIS — Z717 Human immunodeficiency virus [HIV] counseling: Secondary | ICD-10-CM | POA: Diagnosis not present

## 2021-09-21 NOTE — TOC Benefit Eligibility Note (Signed)
Patient Product/process development scientist completed.    The patient is currently admitted and upon discharge could be taking Biktarvy 50-200-25 mg tablets.  The current 30 day co-pay is, $35.00.   The patient is insured through [employer] of [employer]     Roland Earl, CPhT Pharmacy Patient Advocate Specialist [facility] Patient Advocate Team Direct Number: [phone]  Fax: [phone]

## 2021-09-21 NOTE — Telephone Encounter (Signed)
Pharmacy Patient Advocate Encounter  Insurance verification completed.    The patient is insured through [employer] of [employer]   The patient is currently admitted and ran test claims for the following: Biktarvy.  Copays and coinsurance results were relayed to Inpatient clinical team.

## 2021-09-21 NOTE — Progress Notes (Signed)
The purpose of this virtual visit is to provide medical care while limiting exposure to the novel coronavirus (COVID19) for both patient and office staff.   Consent was obtained for phone visit:  Yes.   Answered questions that patient had about telehealth interaction:  Yes.   I discussed the limitations, risks, security and privacy concerns of performing an evaluation and management service by telephone. I also discussed with the patient that there may be a patient responsible charge related to this service. The patient expressed understanding and agreed to proceed.   Patient Location: Home Provider Location: Office This call is to discuss patient's results. Patient was supposed to have his come to see me today.  But because he got discharged yesterday from the hospital secondary to appendectomy he is not able to drive today He has seen his results through MyChart and he is HIV positive His viral load is more than 200,000 His recent HIV antibody is positive I discussed in brief about HIV pathogenesis, need for medication, and good prognosis with medication and further tests to establish his immune status. Patient currently has not informed his wife.  He will do so in the future He will come to the clinic next week Ran prescription Biktarvy with this insurance and he is got a copy of $30 for that.  We will try to get a co-pay card for him to restart the medication. Total time spent on this call  15 minutes

## 2021-09-28 ENCOUNTER — Other Ambulatory Visit
Admission: RE | Admit: 2021-09-28 | Discharge: 2021-09-28 | Disposition: A | Discharge status: Home / Self Care | Payer: BC Managed Care – PPO | Source: Ambulatory Visit | Attending: Infectious Diseases | Admitting: Infectious Diseases

## 2021-09-28 ENCOUNTER — Ambulatory Visit: Payer: BC Managed Care – PPO | Attending: Infectious Diseases | Admitting: Infectious Diseases

## 2021-09-28 ENCOUNTER — Encounter: Payer: Self-pay | Admitting: Infectious Diseases

## 2021-09-28 VITALS — BP 142/86 | HR 67 | Temp 97.3°F | Ht 68.0 in | Wt 265.0 lb

## 2021-09-28 DIAGNOSIS — B20 Human immunodeficiency virus [HIV] disease: Secondary | ICD-10-CM | POA: Insufficient documentation

## 2021-09-28 LAB — COMPREHENSIVE METABOLIC PANEL
ALT: 41 U/L (ref 0–44)
AST: 30 U/L (ref 15–41)
Albumin: 3.8 g/dL (ref 3.5–5.0)
Alkaline Phosphatase: 59 U/L (ref 38–126)
Anion gap: 4 — ABNORMAL LOW (ref 5–15)
BUN: 8 mg/dL (ref 6–20)
CO2: 29 mmol/L (ref 22–32)
Calcium: 9.2 mg/dL (ref 8.9–10.3)
Chloride: 106 mmol/L (ref 98–111)
Creatinine, Ser: 0.92 mg/dL (ref 0.61–1.24)
GFR, Estimated: >60 mL/min (ref >60)
Glucose, Bld: 93 mg/dL (ref 70–99)
Potassium: 3.9 mmol/L (ref 3.5–5.1)
Sodium: 139 mmol/L (ref 135–145)
Total Bilirubin: 0.9 mg/dL (ref 0.3–1.2)
Total Protein: 8.2 g/dL — ABNORMAL HIGH (ref 6.5–8.1)

## 2021-09-28 LAB — HEPATITIS PANEL, ACUTE
HCV Ab: NONREACTIVE
Hep A IgM: NONREACTIVE
Hep B C IgM: NONREACTIVE
Hepatitis B Surface Ag: NONREACTIVE

## 2021-09-28 MED ORDER — BIKTARVY 50-200-25 MG PO TABS
1.0000 | ORAL_TABLET | Freq: Every day | ORAL | 1 refills | Status: DC
Start: 2021-09-28 — End: 2021-11-27

## 2021-09-28 NOTE — Progress Notes (Signed)
NAME: Gary Pennington  DOB: 20-Jun-1990  MRN: [id number]  Date/Time: 09/28/2021 11:28 AM   Subjective:   ?first visit to engage in HIV care Gary Pennington is a 31 y.o. male  was recently in [facility] 09/18/21-09/20/21  for acute appendicitis and underwent lap appendectomy on 09/19/21. A routine HIV test done on 09/19/21 was positive and confirmatory RNA was positive at 285 K Pt has been asymptomatic He is not sure how he got it Denies IVDA/MSM/no Blood transfusion Thinks it could be from collecting trash and possibly getting stuck with needles HE is in a monogamous relationship with his wife of 6 years and they have a 76 month old baby He has not told his wife of his status He does not know whether she is positive HIV diagnosed 09/19/21 Nadir Cd4 [place] VL 285K OI -none HAARt history- naive Acquired thru ? Genotype- NA ? 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 Current Outpatient Medications  Medication Sig Dispense Refill   ibuprofen (ADVIL) 100 MG/5ML suspension Take 30 mLs (600 mg total) by mouth every 8 (eight) hours as needed for moderate pain. 450 mL 0   No current facility-administered medications for this visit.    REVIEW OF SYSTEMS:  Const: negative fever, negative chills, negative weight loss Eyes: negative diplopia or visual changes, negative eye pain ENT: negative coryza, negative sore throat Resp: negative cough, hemoptysis, dyspnea Cards: negative for chest pain, palpitations, lower  extremity edema GU: negative for frequency, dysuria and hematuria Skin: negative for rash and pruritus Heme: negative for easy bruising and gum/nose bleeding MS: negative for myalgias, arthralgias, back pain and muscle weakness Neurolo:negative for headaches, dizziness, vertigo, memory problems  Psych: negative for feelings of anxiety, depression   Objective:  VITALS:  BP (!) 142/86   Pulse 67   Temp (!) 97.3 F (36.3 C) (Temporal)   Ht 5\' 8"  (1.727 m)   Wt 265 lb (120.2 kg)   BMI 40.29 kg/m  PHYSICAL EXAM:  General: Alert, cooperative, no distress, appears stated age. obese Head: Normocephalic, without obvious abnormality, atraumatic. Eyes: Conjunctivae clear, anicteric sclerae. Pupils are equal Nose: Nares normal. No drainage or sinus tenderness. Throat: Lips, mucosa, and tongue normal. No Thrush Neck: Supple, symmetrical, no adenopathy, thyroid: non tender no carotid bruit and no JVD. Back: No CVA tenderness. Lungs: Clear to auscultation bilaterally. No Wheezing or Rhonchi. No rales. Heart: Regular rate and rhythm, no murmur, rub or gallop. Abdomen: Soft, 4 lap sites on the left side has healed well Extremities: Extremities normal, atraumatic, no cyanosis. No edema. No clubbing Skin: No rashes or lesions. Not Jaundiced Lymph: Cervical, supraclavicular normal. Neurologic: Grossly non-focal Pertinent Labs IMAGING RESULTS: Health maintenance Vaccination  Vaccine Date last given comment  Influenza    Hepatitis B    Hepatitis A    Prevnar-PCV-13    Pneumovac-PPSV-23    TdaP    HPV    Shingrix ( zoster vaccine)     ______________________  Labs Lab Result  Date comment  HIV VL     CD4     Genotype     [id number]     HIV antibody     RPR     Quantiferon Gold     Hep C ab     Hepatitis B-ab,ag,c     Hepatitis A-IgM, IgG /T     Lipid     GC/CHL     PAP     HB,PLT,Cr, LFT       Preventive  Procedure Result  Date comment  colonoscopy          Dental exam      Opthal       Impression/Recommendation ?HIV newly diagnosed- today will send labs ( cd4, RPR, Quant gold, Genosure prime,hepatitis panel ) Will start Biktarvy once aday Discussed the side effects of meds including GI in the beginning Discussed HIV  pathogenesis , adherence to med, compliance to visits and partner notification  Asked him to inform his wife and practice safe sex or abstain from sex till he becomes undetectable ? ?recent appendectomy Health maintenance will be updated Needs vaccination updated  ___________________________________________________ Discussed with patient in great detail- spent 50 min Counseling 50% of the visit Follow up 4 weeks

## 2021-09-28 NOTE — Patient Instructions (Signed)
You are here to engage in care- today we will do some labs and will start on Biktarvy 1 pill a day-  Will follow up in 4 weeks

## 2021-09-29 LAB — T-HELPER CELLS CD4/CD8 %
% CD 4 Pos. Lymph.: 10.9 % — ABNORMAL LOW (ref 30.8–58.5)
Absolute CD 4 Helper: 153 /uL — ABNORMAL LOW (ref 359–1519)
Basophils Absolute: 0 10*3/uL (ref 0.0–0.2)
Basos: 1 %
CD3+CD4+ Cells/CD3+CD8+ Cells Bld: 0.17 — ABNORMAL LOW (ref 0.92–3.72)
CD3+CD8+ Cells # Bld: 923 /uL — ABNORMAL HIGH (ref 109–897)
CD3+CD8+ Cells NFr Bld: 65.9 % — ABNORMAL HIGH (ref 12.0–35.5)
EOS (ABSOLUTE): 0.1 10*3/uL (ref 0.0–0.4)
Eos: 4 %
Hematocrit: 41.5 % (ref 37.5–51.0)
Hemoglobin: 14.1 g/dL (ref 13.0–17.7)
Immature Grans (Abs): 0 10*3/uL (ref 0.0–0.1)
Immature Granulocytes: 1 %
Lymphocytes Absolute: 1.4 10*3/uL (ref 0.7–3.1)
Lymphs: 38 %
MCH: 27.4 pg (ref 26.6–33.0)
MCHC: 34 g/dL (ref 31.5–35.7)
MCV: 81 fL (ref 79–97)
Monocytes Absolute: 0.4 10*3/uL (ref 0.1–0.9)
Monocytes: 9 %
Neutrophils Absolute: 1.8 10*3/uL (ref 1.4–7.0)
Neutrophils: 47 %
Platelets: 288 10*3/uL (ref 150–450)
RBC: 5.14 x10E6/uL (ref 4.14–5.80)
RDW: 13.8 % (ref 11.6–15.4)
WBC: 3.8 10*3/uL (ref 3.4–10.8)

## 2021-09-29 LAB — RPR: RPR Ser Ql: NONREACTIVE

## 2021-09-29 LAB — HEPATITIS B SURFACE ANTIBODY, QUANTITATIVE: Hep B S AB Quant (Post): <3.1 m[IU]/mL — ABNORMAL LOW (ref >9.9)

## 2021-10-02 LAB — QUANTIFERON-TB GOLD PLUS: QuantiFERON-TB Gold Plus: NEGATIVE

## 2021-10-02 LAB — QUANTIFERON-TB GOLD PLUS (RQFGPL)
QuantiFERON Mitogen Value: >10 IU/mL
QuantiFERON Nil Value: 0.06 IU/mL
QuantiFERON TB1 Ag Value: 0.05 IU/mL
QuantiFERON TB2 Ag Value: 0.05 IU/mL

## 2021-10-09 LAB — GENOSURE PRIME (GSPRIL)

## 2021-10-24 ENCOUNTER — Other Ambulatory Visit
Admission: RE | Admit: 2021-10-24 | Discharge: 2021-10-24 | Disposition: A | Discharge status: Home / Self Care | Payer: BC Managed Care – PPO | Attending: Infectious Diseases | Admitting: Infectious Diseases

## 2021-10-24 ENCOUNTER — Ambulatory Visit: Payer: BC Managed Care – PPO | Attending: Infectious Diseases | Admitting: Infectious Diseases

## 2021-10-24 ENCOUNTER — Encounter: Payer: Self-pay | Admitting: Infectious Diseases

## 2021-10-24 VITALS — BP 141/85 | HR 63 | Temp 97.1°F | Ht 68.0 in | Wt 273.0 lb

## 2021-10-24 DIAGNOSIS — Z21 Asymptomatic human immunodeficiency virus [HIV] infection status: Secondary | ICD-10-CM | POA: Diagnosis present

## 2021-10-24 DIAGNOSIS — B20 Human immunodeficiency virus [HIV] disease: Secondary | ICD-10-CM

## 2021-10-24 NOTE — Progress Notes (Signed)
NAME: Gary Pennington  DOB: [birth year]  MRN: [id number]  Date/Time: 10/24/2021 10:34 AM   Subjective:  Here for follow up- last seen 09/28/21- started Biktarvy that day- As he cannot swallow pill he crushes the medicine - gets an after taste. No nausea, vomiting, diarrhea, abdominal pain. 100% adherent  Gary Pennington is a 31 y.o. male  was recently in [facility] 09/18/21-09/20/21  for acute appendicitis and underwent lap appendectomy on 09/19/21.  A routine HIV test done on 09/19/21 was positive and confirmatory RNA was positive at 285 K Pt has been asymptomatic He is not sure how he got it Denies IVDA/MSM/no Blood transfusion Thinks it could be from collecting trash and possibly getting stuck with needles HE is in a monogamous relationship with his wife of 6 years and they have a 53 month old baby He told his wife and his aprents and her parents Wife tested negative    HIV diagnosed 09/19/21 Nadir Cd4 15 VL 285K OI -none HAARt history- naive Acquired thru ? Genotype- NA ? 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 Current Outpatient Medications  Medication Sig Dispense Refill   bictegravir-emtricitabine-tenofovir AF (BIKTARVY) 50-200-25 MG TABS tablet Take 1 tablet by mouth daily. 30 tablet 1   No current facility-administered medications for this visit.    REVIEW OF SYSTEMS:  Const: negative fever, negative chills, negative weight loss Eyes: negative diplopia or visual changes, negative eye  pain ENT: negative coryza, negative sore throat Resp: negative cough, hemoptysis, dyspnea Cards: negative for chest pain, palpitations, lower extremity edema GU: negative for frequency, dysuria and hematuria Skin: negative for rash and pruritus Heme: negative for easy bruising and gum/nose bleeding MS: negative for myalgias, arthralgias, back pain and muscle weakness Neurolo:negative for headaches, dizziness, vertigo, memory problems  Psych: negative for feelings of anxiety, depression   Objective:  VITALS:  BP (!) 141/85   Pulse 63   Temp (!) 97.1 F (36.2 C) (Temporal)   Ht 5\' 8"  (1.727 m)   Wt 273 lb (123.8 kg)   BMI 41.51 kg/m  PHYSICAL EXAM:  General: Alert, cooperative, no distress, appears stated age. obese Head: Normocephalic, without obvious abnormality, atraumatic. Eyes: Conjunctivae clear, anicteric sclerae. Pupils are equal Nose: Nares normal. No drainage or sinus tenderness. Throat: Lips, mucosa, and tongue normal. No Thrush Neck: Supple, symmetrical, no adenopathy, thyroid: non tender no carotid bruit and no JVD. Back: No CVA tenderness. Lungs: Clear to auscultation bilaterally. No Wheezing or Rhonchi. No rales. Heart: Regular rate and rhythm, no murmur, rub or gallop. Abdomen: Soft, 4 lap sites on the left side has healed well Extremities: Extremities normal, atraumatic, no cyanosis. No edema. No clubbing Skin: No rashes or lesions. Not Jaundiced Lymph: Cervical, supraclavicular normal. Neurologic: Grossly non-focal Pertinent Labs IMAGING RESULTS: Health maintenance Vaccination  Vaccine Date last given comment  Influenza    Hepatitis B    Hepatitis A    Prevnar-PCV-13    Pneumovac-PPSV-23    TdaP    HPV  Shingrix ( zoster vaccine)     ______________________  Labs Lab Result  Date comment  HIV VL 285K 7/18   CD4 153 (10.9%) 7/27   Genotype NO R 7/27   [id number]     HIV antibody     RPR NR 7/27   Quantiferon Gold NR    Hep C ab NR     Hepatitis B-ab,ag,c     Hepatitis A-IgM, IgG /T     Lipid     GC/CHL     PAP     HB,PLT,Cr, LFT       Preventive  Procedure Result  Date comment  colonoscopy          Dental exam     Opthal       Impression/Recommendation AIDS/?HIV newly diagnosed-on Biktarvy- VL 285k and Cd4 < 150 100% adherent No side effects Today will do HV RNA, CD4 HE notified  his wife  Safe sex to be practiced ? Recent appendectomy Health maintenance  updated Needs Prevnar 20  ___________________________________________________ Discussed with patient in great detail-  Labs today Follow up 4 months( he says he cannot make it in Nov and dec)

## 2021-10-24 NOTE — Patient Instructions (Signed)
You are here for follow up of HIV- today will do labs-

## 2021-10-25 LAB — T-HELPER CELLS CD4/CD8 %
% CD 4 Pos. Lymph.: 20.2 % — ABNORMAL LOW (ref 30.8–58.5)
Absolute CD 4 Helper: 263 /uL — ABNORMAL LOW (ref 359–1519)
Basophils Absolute: 0 10*3/uL (ref 0.0–0.2)
Basos: 1 %
CD3+CD4+ Cells/CD3+CD8+ Cells Bld: 0.38 — ABNORMAL LOW (ref 0.92–3.72)
CD3+CD8+ Cells # Bld: 698 /uL (ref 109–897)
CD3+CD8+ Cells NFr Bld: 53.7 % — ABNORMAL HIGH (ref 12.0–35.5)
EOS (ABSOLUTE): 0.1 10*3/uL (ref 0.0–0.4)
Eos: 3 %
Hematocrit: 43.6 % (ref 37.5–51.0)
Hemoglobin: 14.2 g/dL (ref 13.0–17.7)
Immature Grans (Abs): 0 10*3/uL (ref 0.0–0.1)
Immature Granulocytes: 1 %
Lymphocytes Absolute: 1.3 10*3/uL (ref 0.7–3.1)
Lymphs: 30 %
MCH: 27.6 pg (ref 26.6–33.0)
MCHC: 32.6 g/dL (ref 31.5–35.7)
MCV: 85 fL (ref 79–97)
Monocytes Absolute: 0.3 10*3/uL (ref 0.1–0.9)
Monocytes: 8 %
Neutrophils Absolute: 2.4 10*3/uL (ref 1.4–7.0)
Neutrophils: 57 %
Platelets: 267 10*3/uL (ref 150–450)
RBC: 5.14 x10E6/uL (ref 4.14–5.80)
RDW: 14.5 % (ref 11.6–15.4)
WBC: 4.1 10*3/uL (ref 3.4–10.8)

## 2021-10-25 LAB — HIV-1 RNA QUANT-NO REFLEX-BLD
HIV 1 RNA Quant: 320 copies/mL
LOG10 HIV-1 RNA: 2.505 log10copy/mL

## 2021-10-27 LAB — GENOSURE PRIME (GSPRIL)

## 2021-11-27 ENCOUNTER — Telehealth: Payer: Self-pay

## 2021-11-27 DIAGNOSIS — B20 Human immunodeficiency virus [HIV] disease: Secondary | ICD-10-CM

## 2021-11-27 MED ORDER — BIKTARVY 50-200-25 MG PO TABS: 1.0000 | ORAL_TABLET | Freq: Every day | ORAL | 4 refills | Status: DC

## 2021-11-27 NOTE — Telephone Encounter (Signed)
Patient left voicemail requesting refills on Biktarvy. Patient is scheduled to follow up 03/2022. Refills sent today. Will send mychart message with update. Leatrice Jewels, RMA

## 2022-03-06 ENCOUNTER — Ambulatory Visit: Payer: BC Managed Care – PPO | Admitting: Infectious Diseases

## 2022-03-15 ENCOUNTER — Other Ambulatory Visit
Admission: RE | Admit: 2022-03-15 | Discharge: 2022-03-15 | Disposition: A | Discharge status: Home / Self Care | Payer: BC Managed Care – PPO | Source: Ambulatory Visit | Attending: Infectious Diseases | Admitting: Infectious Diseases

## 2022-03-15 ENCOUNTER — Encounter: Payer: Self-pay | Admitting: Infectious Diseases

## 2022-03-15 ENCOUNTER — Ambulatory Visit: Payer: BC Managed Care – PPO | Attending: Infectious Diseases | Admitting: Infectious Diseases

## 2022-03-15 VITALS — BP 144/87 | HR 90 | Temp 97.5°F | Ht 68.0 in | Wt 305.0 lb

## 2022-03-15 DIAGNOSIS — Z23 Encounter for immunization: Secondary | ICD-10-CM | POA: Diagnosis not present

## 2022-03-15 DIAGNOSIS — B20 Human immunodeficiency virus [HIV] disease: Secondary | ICD-10-CM

## 2022-03-15 DIAGNOSIS — Z21 Asymptomatic human immunodeficiency virus [HIV] infection status: Secondary | ICD-10-CM | POA: Insufficient documentation

## 2022-03-15 LAB — COMPREHENSIVE METABOLIC PANEL
ALT: 47 U/L — ABNORMAL HIGH (ref 0–44)
AST: 38 U/L (ref 15–41)
Albumin: 4.3 g/dL (ref 3.5–5.0)
Alkaline Phosphatase: 66 U/L (ref 38–126)
Anion gap: 9 (ref 5–15)
BUN: 14 mg/dL (ref 6–20)
CO2: 27 mmol/L (ref 22–32)
Calcium: 9.4 mg/dL (ref 8.9–10.3)
Chloride: 104 mmol/L (ref 98–111)
Creatinine, Ser: 1.09 mg/dL (ref 0.61–1.24)
GFR, Estimated: >60 mL/min (ref >60)
Glucose, Bld: 81 mg/dL (ref 70–99)
Potassium: 3.7 mmol/L (ref 3.5–5.1)
Sodium: 140 mmol/L (ref 135–145)
Total Bilirubin: 0.6 mg/dL (ref 0.3–1.2)
Total Protein: 8.2 g/dL — ABNORMAL HIGH (ref 6.5–8.1)

## 2022-03-15 MED ORDER — BIKTARVY 50-200-25 MG PO TABS: 1.0000 | ORAL_TABLET | Freq: Every day | ORAL | 6 refills | Status: DC

## 2022-03-15 NOTE — Addendum Note (Signed)
Addended by: Daisy Floro T on: 03/15/2022 12:27 PM   Modules accepted: Orders

## 2022-03-15 NOTE — Progress Notes (Signed)
NAME: Gary Pennington  DOB: [birth year]  MRN: [id number]  Date/Time: 03/15/2022 11:38 AM   Subjective:  Here for follow up-for AIDS last seen aug 2023 - Gary Pennington is a 32 y.o. male  was  in [facility] 09/18/21-09/20/21  for acute appendicitis and underwent lap appendectomy on 09/19/21.  A routine HIV test done on 09/19/21 was positive and confirmatory RNA was positive at 285 K and cd4 was 153 ( 10%)  HE  started Biktarvy in Aug 2023 Doing well 100% adherent  But has gained 40 pounds since July 2023  Denies IVDA/MSM/no Blood transfusion Thinks it could be from collecting trash and possibly getting stuck with needles HE is in a monogamous relationship with his wife of 6 years and they have a 80 month old baby He told his wife and his aprents and her parents Wife tested negative    HIV diagnosed 09/19/21 Nadir Cd4 15 VL 285K OI -none HAARt history- naive Acquired thru Denies IVDA/MSM/no Blood transfusion Thinks it could be from collecting trash and possibly getting stuck with needles Genotype- NA ? PMH Fracture rt arm Trauma to his fingers- sheared off- and sewn together  PSH Tonsillectomy Past Surgical History:  Procedure Laterality Date   XI ROBOTIC LAPAROSCOPIC ASSISTED APPENDECTOMY N/A 09/19/2021   Procedure: XI ROBOTIC LAPAROSCOPIC ASSISTED APPENDECTOMY;  Surgeon: Herbert Pun, MD;  Location: [facility] ORS;  Service: General;  Laterality: N/A;    [place] Works as a Development worker, international aid with town of [place] Lives with wife and child of 4 months Non smoker No alcohol No drugs   FH Aunt-  Diabetes No Known Allergies ? Current Outpatient Medications  Medication Sig Dispense Refill   bictegravir-emtricitabine-tenofovir AF (BIKTARVY) 50-200-25 MG TABS tablet Take 1 tablet by mouth daily. 30 tablet 4   No current facility-administered medications for this visit.    REVIEW OF SYSTEMS:  Const: negative fever, negative chills, negative weight loss Eyes: negative diplopia or  visual changes, negative eye pain ENT: negative coryza, negative sore throat Resp: negative cough, hemoptysis, dyspnea Cards: negative for chest pain, palpitations, lower extremity edema GU: negative for frequency, dysuria and hematuria Skin: negative for rash and pruritus Heme: negative for easy bruising and gum/nose bleeding MS: negative for myalgias, arthralgias, back pain and muscle weakness Neurolo:negative for headaches, dizziness, vertigo, memory problems  Psych: negative for feelings of anxiety, depression   Objective:  VITALS:  BP (!) 144/87   Pulse 90   Temp (!) 97.5 F (36.4 C) (Temporal)   Ht 5\' 8"  (1.727 m)   Wt (!) 305 lb (138.3 kg)   BMI 46.38 kg/m  PHYSICAL EXAM:  General: Alert, cooperative, no distress, appears stated age. obese Head: Normocephalic, without obvious abnormality, atraumatic. Eyes: Conjunctivae clear, anicteric sclerae. Pupils are equal Nose: Nares normal. No drainage or sinus tenderness. Throat: Lips, mucosa, and tongue normal. No Thrush Neck: Supple, symmetrical, no adenopathy, thyroid: non tender no carotid bruit and no JVD. Back: No CVA tenderness. Lungs: Clear to auscultation bilaterally. No Wheezing or Rhonchi. No rales. Heart: Regular rate and rhythm, no murmur, rub or gallop. Abdomen: Soft, 4 lap sites on the left side has healed well Extremities: Extremities normal, atraumatic, no cyanosis. No edema. No clubbing Skin: No rashes or lesions. Not Jaundiced Lymph: Cervical, supraclavicular normal. Neurologic: Grossly non-focal Pertinent Labs IMAGING RESULTS: Health maintenance Vaccination  Vaccine Date last given comment  Influenza    Hepatitis B 03/15/22   Hepatitis A    Prevnar-PCV-20 03/15/22   Pneumovac-PPSV-23  TdaP    HPV    Shingrix ( zoster vaccine)     ______________________  Labs Lab Result  Date comment  HIV VL 285K 7/18   CD4 153 (10.9%) 7/27   Genotype NO R 7/27   [id number]     HIV antibody     RPR NR 7/27    Quantiferon Gold NR    Hep C ab NR    Hepatitis B-ab,ag,c     Hepatitis A-IgM, IgG /T     Lipid     GC/CHL     PAP     HB,PLT,Cr, LFT       Preventive  Procedure Result  Date comment  colonoscopy          Dental exam     Opthal       Impression/Recommendation AIDS/?HIV newly diagnosed-on Biktarvy- VL from August is 340  and Cd4 is 243 100% adherent No side effects Today will do HV RNA, CD4  Weight gain of 40 pounds- discussed stopping soda, cutting down processed food, eating more of home cooked food   Wife aware of his status and she is negative ? appendectomy Health maintenance  updated Prevnar 20 HEP B ( heplisav) 1 dose given today as Ab < 3  ____________________________________________ Follow up 6 months

## 2022-03-16 LAB — RPR: RPR Ser Ql: NONREACTIVE

## 2022-03-17 LAB — T-HELPER CELLS CD4/CD8 %
% CD 4 Pos. Lymph.: 22.1 % — ABNORMAL LOW (ref 30.8–58.5)
Absolute CD 4 Helper: 221 /uL — ABNORMAL LOW (ref 359–1519)
Basophils Absolute: 0 10*3/uL (ref 0.0–0.2)
Basos: 1 %
CD3+CD4+ Cells/CD3+CD8+ Cells Bld: 0.66 — ABNORMAL LOW (ref 0.92–3.72)
CD3+CD8+ Cells # Bld: 336 /uL (ref 109–897)
CD3+CD8+ Cells NFr Bld: 33.6 % (ref 12.0–35.5)
EOS (ABSOLUTE): 0.1 10*3/uL (ref 0.0–0.4)
Eos: 5 %
Hematocrit: 27.2 % — ABNORMAL LOW (ref 37.5–51.0)
Hemoglobin: 9 g/dL — ABNORMAL LOW (ref 13.0–17.7)
Immature Grans (Abs): 0 10*3/uL (ref 0.0–0.1)
Immature Granulocytes: 1 %
Lymphocytes Absolute: 1 10*3/uL (ref 0.7–3.1)
Lymphs: 41 %
MCH: 33 pg (ref 26.6–33.0)
MCHC: 33.1 g/dL (ref 31.5–35.7)
MCV: 100 fL — ABNORMAL HIGH (ref 79–97)
Monocytes Absolute: 0.2 10*3/uL (ref 0.1–0.9)
Monocytes: 10 %
Neutrophils Absolute: 1.1 10*3/uL — ABNORMAL LOW (ref 1.4–7.0)
Neutrophils: 42 %
Platelets: 91 10*3/uL — ABNORMAL LOW (ref 150–450)
RBC: 2.73 x10E6/uL — ABNORMAL LOW (ref 4.14–5.80)
RDW: 16.2 % — ABNORMAL HIGH (ref 11.6–15.4)
WBC: 2.5 10*3/uL — ABNORMAL LOW (ref 3.4–10.8)

## 2022-03-17 LAB — HIV-1 RNA QUANT-NO REFLEX-BLD
HIV 1 RNA Quant: 90 copies/mL
LOG10 HIV-1 RNA: 1.954 log10copy/mL

## 2022-03-19 LAB — GENOSURE PRIME (GSPRIL)

## 2022-03-20 ENCOUNTER — Telehealth: Payer: Self-pay

## 2022-03-20 NOTE — Telephone Encounter (Signed)
-----  Message from Tsosie Billing, MD sent at 03/19/2022  4:35 PM EST ----- Please lt him know VL 90 and need to become < 40- ask him not to miss any dose ( he is missing as he goes to sleep) ----- Message ----- From: Interface, Lab [facility] [place] Sent: 03/15/2022   1:16 PM EST To: Tsosie Billing, MD

## 2022-03-29 LAB — MISC LABCORP TEST (SEND OUT)
LabCorp test name: 551776
Labcorp test code: 551776

## 2022-09-25 ENCOUNTER — Other Ambulatory Visit
Admission: RE | Admit: 2022-09-25 | Discharge: 2022-09-25 | Disposition: A | Discharge status: Home / Self Care | Payer: BC Managed Care – PPO | Source: Ambulatory Visit | Attending: Infectious Diseases | Admitting: Infectious Diseases

## 2022-09-25 ENCOUNTER — Ambulatory Visit: Payer: BC Managed Care – PPO | Attending: Infectious Diseases | Admitting: Infectious Diseases

## 2022-09-25 ENCOUNTER — Encounter: Payer: Self-pay | Admitting: Infectious Diseases

## 2022-09-25 VITALS — BP 121/85 | HR 64 | Temp 96.8°F | Ht 68.0 in | Wt 301.0 lb

## 2022-09-25 DIAGNOSIS — B20 Human immunodeficiency virus [HIV] disease: Secondary | ICD-10-CM | POA: Diagnosis present

## 2022-09-25 DIAGNOSIS — R635 Abnormal weight gain: Secondary | ICD-10-CM | POA: Diagnosis not present

## 2022-09-25 DIAGNOSIS — Z21 Asymptomatic human immunodeficiency virus [HIV] infection status: Secondary | ICD-10-CM | POA: Insufficient documentation

## 2022-09-25 LAB — CBC WITH DIFFERENTIAL/PLATELET
Abs Immature Granulocytes: 0.02 10*3/uL (ref 0.00–0.07)
Basophils Absolute: 0 10*3/uL (ref 0.0–0.1)
Basophils Relative: 1 %
Eosinophils Absolute: 0.2 10*3/uL (ref 0.0–0.5)
Eosinophils Relative: 5 %
HCT: 42.8 % (ref 39.0–52.0)
Hemoglobin: 14.7 g/dL (ref 13.0–17.0)
Immature Granulocytes: 0 %
Lymphocytes Relative: 29 %
Lymphs Abs: 1.4 10*3/uL (ref 0.7–4.0)
MCH: 29.2 pg (ref 26.0–34.0)
MCHC: 34.3 g/dL (ref 30.0–36.0)
MCV: 84.9 fL (ref 80.0–100.0)
Monocytes Absolute: 0.4 10*3/uL (ref 0.1–1.0)
Monocytes Relative: 8 %
Neutro Abs: 2.8 10*3/uL (ref 1.7–7.7)
Neutrophils Relative %: 57 %
Platelets: 267 10*3/uL (ref 150–400)
RBC: 5.04 MIL/uL (ref 4.22–5.81)
RDW: 13.2 % (ref 11.5–15.5)
WBC: 4.8 10*3/uL (ref 4.0–10.5)
nRBC: 0 % (ref 0.0–0.2)

## 2022-09-25 LAB — COMPREHENSIVE METABOLIC PANEL
ALT: 48 U/L — ABNORMAL HIGH (ref 0–44)
AST: 36 U/L (ref 15–41)
Albumin: 3.8 g/dL (ref 3.5–5.0)
Alkaline Phosphatase: 59 U/L (ref 38–126)
Anion gap: 5 (ref 5–15)
BUN: 11 mg/dL (ref 6–20)
CO2: 27 mmol/L (ref 22–32)
Calcium: 9 mg/dL (ref 8.9–10.3)
Chloride: 104 mmol/L (ref 98–111)
Creatinine, Ser: 0.93 mg/dL (ref 0.61–1.24)
GFR, Estimated: >60 mL/min (ref >60)
Glucose, Bld: 97 mg/dL (ref 70–99)
Potassium: 3.7 mmol/L (ref 3.5–5.1)
Sodium: 136 mmol/L (ref 135–145)
Total Bilirubin: 0.6 mg/dL (ref 0.3–1.2)
Total Protein: 7.3 g/dL (ref 6.5–8.1)

## 2022-09-25 LAB — HEPATITIS A ANTIBODY, TOTAL: hep A Total Ab: NONREACTIVE

## 2022-09-25 NOTE — Progress Notes (Signed)
NAME: Gary Pennington  DOB: 1990-09-23  MRN: [id number]  Date/Time: 09/25/2022 11:45 AM   Subjective:  Here for follow up-for AIDS last seen Jan 2024 - Gary Pennington is a 32 y.o. diagnosed with HIV on  09/19/21 on aroutine test in the hospital  HE  started [facility] in Aug 2023 Doing well 100% adherent  But ha4 gained 40 pounds since July 2023 and that he try to lose some by cutting off soda completely.  Since he went on vacation he has gained a few more pounds He is thinking of bariatric band placement He is afraid of needles and hence Wegovy or Ozempic is not his first choice     HIV diagnosed 09/19/21 Nadir Cd4 153 VL 285K OI -none HAARt history- naive Acquired thru Denies IVDA/MSM/no Blood transfusion Thinks it could be from collecting trash and possibly getting stuck with needles Genotype- NA ?Denies IVDA/MSM/no Blood transfusion Thinks it could be from collecting trash and possibly getting stuck with needles HE is in a monogamous relationship with his wife of 6 years and they have a 77 month old girl He told his wife and his aprents and her parents Wife tested negative   PMH Fracture rt arm Trauma to his fingers- sheared off- and sewn together  PSH Tonsillectomy Past Surgical History:  Procedure Laterality Date   XI ROBOTIC LAPAROSCOPIC ASSISTED APPENDECTOMY N/A 09/19/2021   Procedure: XI ROBOTIC LAPAROSCOPIC ASSISTED APPENDECTOMY;  Surgeon: Carolan Shiver, MD;  Location: [facility] ORS;  Service: General;  Laterality: N/A;    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 Current Outpatient Medications  Medication Sig Dispense Refill   bictegravir-emtricitabine-tenofovir AF (BIKTARVY) 50-200-25 MG TABS tablet Take 1 tablet by mouth daily. 30 tablet 6   No current facility-administered medications for this visit.    REVIEW OF SYSTEMS:   Const: negative fever, negative chills, negative weight loss Eyes: negative diplopia or visual changes, negative eye pain ENT: negative coryza, negative sore throat Resp: negative cough, hemoptysis, dyspnea Cards: negative for chest pain, palpitations, lower extremity edema GU: negative for frequency, dysuria and hematuria Skin: negative for rash and pruritus Heme: negative for easy bruising and gum/nose bleeding MS: negative for myalgias, arthralgias, back pain and muscle weakness Neurolo:negative for headaches, dizziness, vertigo, memory problems  Psych: negative for feelings of anxiety, depression   Objective:  VITALS:  BP (!) 145/88   Pulse 64   Temp (!) 96.8 F (36 C) (Temporal)   Ht 5\' 8"  (1.727 m)   Wt (!) 301 lb (136.5 kg)   BMI 45.77 kg/m  PHYSICAL EXAM:  General: Alert, cooperative, no distress, appears stated age.  Increased BMI Head: Normocephalic, without obvious abnormality, atraumatic. Eyes: Conjunctivae clear, anicteric sclerae. Pupils are equal Nose: Nares normal. No drainage or sinus tenderness. Throat: Lips, mucosa, and tongue normal. No Thrush Neck: Supple, symmetrical, no adenopathy, thyroid: non tender no carotid bruit and no JVD. Back: No CVA tenderness. Lungs: Clear to auscultation bilaterally. No Wheezing or Rhonchi. No rales. Heart: Regular rate and rhythm, no murmur, rub or gallop. Abdomen: Soft, 4 lap sites on the left side has healed well Extremities: Extremities normal, atraumatic, no cyanosis. No edema. No clubbing Skin: No rashes or lesions. Not Jaundiced Lymph: Cervical, supraclavicular normal. Neurologic: Grossly non-focal Pertinent Labs IMAGING RESULTS: Health maintenance Vaccination  Vaccine Date last given comment  Influenza    Hepatitis B Feb 10, 1992, 04/22/1992, 08/12/1992 03/15/22   Hepatitis A    Prevnar-PCV-20 03/15/22   Pneumovac-PPSV-23    TdaP 01/26/20   HPV    Shingrix ( zoster vaccine)      ______________________  Labs Lab Result  Date comment  HIV VL 90 1/24   CD4 221(22%) 1/24   Genotype NO R 7/27   [id number]     HIV antibody     RPR NR 1/24   Quantiferon Gold NR    Hep C ab NR    Hepatitis B-ab,ag,c     Hepatitis A-IgM, IgG /T     Lipid     GC/CHL     PAP     HB,PLT,Cr, LFT       Preventive  Procedure Result  Date comment  colonoscopy          Dental exam     Opthal       Impression/Recommendation AIDS/?-on Biktarvy- VL from jan 2024 is 90  and Cd4 is 223 100% adherent No side effects Today will do HV RNA, CD4 Patient is considering cabenuva intramuscular injection but at the same time is afraid of needles  Weight gain of 40 pounds- discussed stopping soda, cutting down processed food, eating more of home cooked food   Wife aware of his status and she is negative ? appendectomy Health maintenance  updated   ____________________________________________ Follow up 6 months

## 2022-09-26 LAB — T-HELPER CELLS CD4/CD8 %
% CD 4 Pos. Lymph.: 24.9 % — ABNORMAL LOW (ref 30.8–58.5)
Absolute CD 4 Helper: 324 /uL — ABNORMAL LOW (ref 359–1519)
Basophils Absolute: 0 10*3/uL (ref 0.0–0.2)
Basos: 1 %
CD3+CD4+ Cells/CD3+CD8+ Cells Bld: 0.83 — ABNORMAL LOW (ref 0.92–3.72)
CD3+CD8+ Cells # Bld: 391 /uL (ref 109–897)
CD3+CD8+ Cells NFr Bld: 30.1 % (ref 12.0–35.5)
EOS (ABSOLUTE): 0.2 10*3/uL (ref 0.0–0.4)
Eos: 4 %
Hematocrit: 44.3 % (ref 37.5–51.0)
Hemoglobin: 14.9 g/dL (ref 13.0–17.7)
Immature Grans (Abs): 0 10*3/uL (ref 0.0–0.1)
Immature Granulocytes: 0 %
Lymphocytes Absolute: 1.3 10*3/uL (ref 0.7–3.1)
Lymphs: 29 %
MCH: 29 pg (ref 26.6–33.0)
MCHC: 33.6 g/dL (ref 31.5–35.7)
MCV: 86 fL (ref 79–97)
Monocytes Absolute: 0.4 10*3/uL (ref 0.1–0.9)
Monocytes: 8 %
Neutrophils Absolute: 2.7 10*3/uL (ref 1.4–7.0)
Neutrophils: 58 %
Platelets: 272 10*3/uL (ref 150–450)
RBC: 5.14 x10E6/uL (ref 4.14–5.80)
RDW: 13.3 % (ref 11.6–15.4)
WBC: 4.6 10*3/uL (ref 3.4–10.8)

## 2022-09-26 LAB — HIV-1 RNA QUANT-NO REFLEX-BLD
HIV 1 RNA Quant: 30 copies/mL
LOG10 HIV-1 RNA: 1.477 log10copy/mL

## 2022-09-26 LAB — HEPATITIS B SURFACE ANTIBODY, QUANTITATIVE: Hep B S AB Quant (Post): 50.2 m[IU]/mL

## 2022-09-26 LAB — RPR: RPR Ser Ql: NONREACTIVE

## 2022-09-27 ENCOUNTER — Encounter: Payer: Self-pay | Admitting: Infectious Diseases

## 2022-09-27 LAB — QUANTIFERON-TB GOLD PLUS: QuantiFERON-TB Gold Plus: NEGATIVE

## 2022-09-27 LAB — QUANTIFERON-TB GOLD PLUS (RQFGPL)
QuantiFERON Nil Value: 0.08 IU/mL
QuantiFERON TB1 Ag Value: 0.08 IU/mL
QuantiFERON TB2 Ag Value: 0.08 IU/mL

## 2022-11-05 ENCOUNTER — Emergency Department: Payer: BC Managed Care – PPO

## 2022-11-05 ENCOUNTER — Other Ambulatory Visit: Payer: Self-pay

## 2022-11-05 ENCOUNTER — Emergency Department
Admission: EM | Admit: 2022-11-05 | Discharge: 2022-11-05 | Disposition: A | Discharge status: Home / Self Care | Payer: BC Managed Care – PPO | Attending: Emergency Medicine | Admitting: Emergency Medicine

## 2022-11-05 DIAGNOSIS — B028 Zoster with other complications: Secondary | ICD-10-CM | POA: Diagnosis not present

## 2022-11-05 DIAGNOSIS — R21 Rash and other nonspecific skin eruption: Secondary | ICD-10-CM | POA: Insufficient documentation

## 2022-11-05 DIAGNOSIS — R220 Localized swelling, mass and lump, head: Secondary | ICD-10-CM | POA: Diagnosis present

## 2022-11-05 DIAGNOSIS — B0221 Postherpetic geniculate ganglionitis: Secondary | ICD-10-CM | POA: Insufficient documentation

## 2022-11-05 DIAGNOSIS — B20 Human immunodeficiency virus [HIV] disease: Secondary | ICD-10-CM | POA: Diagnosis not present

## 2022-11-05 LAB — CBC WITH DIFFERENTIAL/PLATELET
Abs Immature Granulocytes: 0.02 10*3/uL (ref 0.00–0.07)
Basophils Absolute: 0 10*3/uL (ref 0.0–0.1)
Basophils Relative: 1 %
Eosinophils Absolute: 0.1 10*3/uL (ref 0.0–0.5)
Eosinophils Relative: 2 %
HCT: 44.8 % (ref 39.0–52.0)
Hemoglobin: 14.7 g/dL (ref 13.0–17.0)
Immature Granulocytes: 0 %
Lymphocytes Relative: 24 %
Lymphs Abs: 1.3 10*3/uL (ref 0.7–4.0)
MCH: 28.8 pg (ref 26.0–34.0)
MCHC: 32.8 g/dL (ref 30.0–36.0)
MCV: 87.7 fL (ref 80.0–100.0)
Monocytes Absolute: 0.8 10*3/uL (ref 0.1–1.0)
Monocytes Relative: 13 %
Neutro Abs: 3.5 10*3/uL (ref 1.7–7.7)
Neutrophils Relative %: 60 %
Platelets: 224 10*3/uL (ref 150–400)
RBC: 5.11 MIL/uL (ref 4.22–5.81)
RDW: 13.1 % (ref 11.5–15.5)
WBC: 5.7 10*3/uL (ref 4.0–10.5)
nRBC: 0 % (ref 0.0–0.2)

## 2022-11-05 LAB — COMPREHENSIVE METABOLIC PANEL
ALT: 42 U/L (ref 0–44)
AST: 37 U/L (ref 15–41)
Albumin: 3.9 g/dL (ref 3.5–5.0)
Alkaline Phosphatase: 59 U/L (ref 38–126)
Anion gap: 7 (ref 5–15)
BUN: 9 mg/dL (ref 6–20)
CO2: 25 mmol/L (ref 22–32)
Calcium: 8.6 mg/dL — ABNORMAL LOW (ref 8.9–10.3)
Chloride: 100 mmol/L (ref 98–111)
Creatinine, Ser: 1.03 mg/dL (ref 0.61–1.24)
GFR, Estimated: >60 mL/min (ref >60)
Glucose, Bld: 102 mg/dL — ABNORMAL HIGH (ref 70–99)
Potassium: 4.3 mmol/L (ref 3.5–5.1)
Sodium: 132 mmol/L — ABNORMAL LOW (ref 135–145)
Total Bilirubin: 0.8 mg/dL (ref 0.3–1.2)
Total Protein: 7.4 g/dL (ref 6.5–8.1)

## 2022-11-05 LAB — LACTIC ACID, PLASMA: Lactic Acid, Venous: 1 mmol/L (ref 0.5–1.9)

## 2022-11-05 MED ORDER — SULFAMETHOXAZOLE-TRIMETHOPRIM 800-160 MG PO TABS: 1.0000 | ORAL_TABLET | Freq: Two times a day (BID) | ORAL | 0 refills | Status: AC

## 2022-11-05 MED ORDER — TETRACAINE HCL 0.5 % OP SOLN
2.0000 [drp] | Freq: Once | OPHTHALMIC | Status: AC
  Administered 2022-11-05: 2 [drp] via OPHTHALMIC
  Filled 2022-11-05: qty 4

## 2022-11-05 MED ORDER — POLYMYXIN B-TRIMETHOPRIM 10000-0.1 UNIT/ML-% OP SOLN: 2.0000 [drp] | Freq: Four times a day (QID) | OPHTHALMIC | 0 refills | Status: AC

## 2022-11-05 MED ORDER — SULFAMETHOXAZOLE-TRIMETHOPRIM 800-160 MG PO TABS
1.0000 | ORAL_TABLET | Freq: Once | ORAL | Status: AC
  Administered 2022-11-05: 1 via ORAL
  Filled 2022-11-05: qty 1

## 2022-11-05 MED ORDER — FLUORESCEIN SODIUM 1 MG OP STRP
2.0000 | ORAL_STRIP | Freq: Once | OPHTHALMIC | Status: AC
  Administered 2022-11-05: 2 via OPHTHALMIC
  Filled 2022-11-05: qty 2

## 2022-11-05 MED ORDER — IOHEXOL 300 MG/ML  SOLN
100.0000 mL | Freq: Once | INTRAMUSCULAR | Status: AC | PRN
  Administered 2022-11-05: 100 mL via INTRAVENOUS

## 2022-11-05 NOTE — ED Provider Notes (Signed)
[facility] Provider Note    Event Date/Time   First MD Initiated Contact with Patient 11/05/22 1012     (approximate)   History   Facial Swelling   HPI  Gary Pennington is a 32 y.o. male   Past medical history of HIV on Biktarvy with a last CD4 count of 324 in July 2024 who presents to the emergency department with facial swelling around the right eye.  Mild headache.  3 days ago he developed skin rash to the left side of his face, forehead, scalp and left eye swelling no discharge no eye pain no visual changes.  No fever or chills.  There is a bumpy rash to the left side of his face and went to urgent care yesterday diagnosed with shingles and started on valacyclovir.  Independent Historian contributed to assessment above: His family members at bedside to corroborate information given above.  External Medical Documents Reviewed: Infectious disease note from earlier this year regarding his HIV and medications      Physical Exam   Triage Vital Signs: ED Triage Vitals  Encounter Vitals Group     BP 11/05/22 1009 (!) 151/92     Systolic BP Percentile --      Diastolic BP Percentile --      Pulse Rate 11/05/22 1009 84     Resp 11/05/22 1009 16     Temp 11/05/22 1009 98.7 F (37.1 C)     Temp Source 11/05/22 1009 Oral     SpO2 11/05/22 1009 100 %     Weight 11/05/22 1007 (!) 300 lb 14.9 oz (136.5 kg)     Height 11/05/22 1007 5\' 8"  (1.727 m)     Head Circumference --      Peak Flow --      Pain Score 11/05/22 1007 7     Pain Loc --      Pain Education --      Exclude from Growth Chart --     Most recent vital signs: Vitals:   11/05/22 1100 11/05/22 1227  BP: (!) 141/75 136/76  Pulse: 75 93  Resp:    Temp:    SpO2: 92% 100%    General: Awake, no distress.  CV:  Good peripheral perfusion.  Resp:  Normal effort.  Abd:  No distention.  Other:  There is a vesicular rash on the left side of his face involving his forehead scalp consistent  with shingles.  Ear canal looks normal.  There is swelling around the periorbital area of his left eye, but fluorescein exam shows no abnormal uptake, dendritic lesions, and extraocular movements are intact, pupils equal reactive.  No proptosis.  No intraoral lesions.  No nasal lesions.   ED Results / Procedures / Treatments   Labs (all labs ordered are listed, but only abnormal results are displayed) Labs Reviewed  COMPREHENSIVE METABOLIC PANEL - Abnormal; Notable for the following components:      Result Value   Sodium 132 (*)    Glucose, Bld 102 (*)    Calcium 8.6 (*)    All other components within normal limits  CBC WITH DIFFERENTIAL/PLATELET  LACTIC ACID, PLASMA     I ordered and reviewed the above labs they are notable for cell counts within normal limits   RADIOLOGY I independently reviewed and interpreted CT orbits and see no obvious orbital cellulitis I also reviewed radiologist's formal read.   PROCEDURES:  Critical Care performed: No  Procedures  MEDICATIONS ORDERED IN ED: Medications  tetracaine (PONTOCAINE) 0.5 % ophthalmic solution 2 drop (2 drops Both Eyes Given by Other 11/05/22 1031)  fluorescein ophthalmic strip 2 strip (2 strips Both Eyes Given by Other 11/05/22 1032)  iohexol (OMNIPAQUE) 300 MG/ML solution 100 mL (100 mLs Intravenous Contrast Given 11/05/22 1129)  sulfamethoxazole-trimethoprim (BACTRIM DS) 800-160 MG per tablet 1 tablet (1 tablet Oral Given 11/05/22 1226)     IMPRESSION / MDM / ASSESSMENT AND PLAN / ED COURSE  I reviewed the triage vital signs and the nursing notes.                                Patient's presentation is most consistent with acute presentation with potential threat to life or bodily function.  Differential diagnosis includes, but is not limited to, orbital cellulitis, Ramsay Hunt syndrome, herpes zoster ophthalmicus, disseminated herpes shingles infection   The patient is on the cardiac monitor to evaluate for  evidence of arrhythmia and/or significant heart rate changes.  MDM: Immunocompromised patient with Ramsay Hunt/shingles infection.  Periorbital cellulitis, concern for orbital cellulitis got a CT with IV contrast of the orbits which shows no signs of orbital cellulitis.  No dendritic lesions or eye pain to suggest direct eye involvement.  He already started on valacyclovir, continue this medication.  Will give prophylactic antibiotic eyedrops for bacterial conjunctivitis, as well as Bactrim for preseptal cellulitis.  Patient appears well nontoxic doubt disseminated disease without other symptoms, or areas of body affected.  Will follow-up with [facility] for further eye examination this week.  Follow-up with PMD as well.  Return if worsening.  Discharge.         FINAL CLINICAL IMPRESSION(S) / ED DIAGNOSES   Final diagnoses:  Herpes zoster with other complication  Ramsay Hunt syndrome (geniculate herpes zoster)     Rx / DC Orders   ED Discharge Orders          Ordered    sulfamethoxazole-trimethoprim (BACTRIM DS) 800-160 MG tablet  2 times daily        11/05/22 1223    trimethoprim-polymyxin b (POLYTRIM) ophthalmic solution  Every 6 hours        11/05/22 1223             Note:  This document was prepared using Dragon voice recognition software and may include unintentional dictation errors.    Pilar Jarvis, MD 11/05/22 [phone]

## 2022-11-05 NOTE — ED Triage Notes (Signed)
Pt here with facial swelling on Wed. Pt left eye is completely swollen shut, hx of shingles in 3rd grade. Pt states his eye is burning and his scalp is itchy. Pt has a mild temp but denies fever, N, V, or D.
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
## 2022-11-05 NOTE — ED Notes (Signed)
Pt ambulatory to restroom

## 2022-11-05 NOTE — Discharge Instructions (Addendum)
Sinew to take your valacyclovir antiviral as prescribed.  Use the eyedrops as prescribed.  Use the Bactrim antibiotic for cellulitis as prescribed.  Call [facility] tomorrow morning for an appointment.  Thank you for choosing [place] for your health care today!  Please see your primary doctor this week for a follow up appointment.   If you have any new, worsening, or unexpected symptoms call your doctor right away or come back to the emergency department for reevaluation.  It was my pleasure to care for you today.   Daneil Dan Modesto Charon, MD

## 2022-11-22 ENCOUNTER — Other Ambulatory Visit: Payer: Self-pay

## 2022-11-22 DIAGNOSIS — B20 Human immunodeficiency virus [HIV] disease: Secondary | ICD-10-CM

## 2022-11-22 MED ORDER — BIKTARVY 50-200-25 MG PO TABS
1.0000 | ORAL_TABLET | Freq: Every day | ORAL | 6 refills | Status: DC
Start: 2022-11-22 — End: 2023-03-28

## 2023-03-28 ENCOUNTER — Other Ambulatory Visit
Admission: RE | Admit: 2023-03-28 | Discharge: 2023-03-28 | Disposition: A | Discharge status: Home / Self Care | Payer: BC Managed Care – PPO | Source: Ambulatory Visit | Attending: Infectious Diseases | Admitting: Infectious Diseases

## 2023-03-28 ENCOUNTER — Encounter: Payer: Self-pay | Admitting: Infectious Diseases

## 2023-03-28 ENCOUNTER — Ambulatory Visit: Payer: BC Managed Care – PPO | Attending: Infectious Diseases | Admitting: Infectious Diseases

## 2023-03-28 VITALS — BP 154/94 | HR 65 | Temp 97.7°F | Ht 68.0 in | Wt 280.0 lb

## 2023-03-28 DIAGNOSIS — Z21 Asymptomatic human immunodeficiency virus [HIV] infection status: Secondary | ICD-10-CM | POA: Diagnosis present

## 2023-03-28 DIAGNOSIS — B20 Human immunodeficiency virus [HIV] disease: Secondary | ICD-10-CM | POA: Diagnosis present

## 2023-03-28 DIAGNOSIS — Z9189 Other specified personal risk factors, not elsewhere classified: Secondary | ICD-10-CM | POA: Diagnosis not present

## 2023-03-28 DIAGNOSIS — R635 Abnormal weight gain: Secondary | ICD-10-CM | POA: Insufficient documentation

## 2023-03-28 DIAGNOSIS — Z9089 Acquired absence of other organs: Secondary | ICD-10-CM | POA: Diagnosis not present

## 2023-03-28 DIAGNOSIS — Z79624 Long term (current) use of inhibitors of nucleotide synthesis: Secondary | ICD-10-CM | POA: Insufficient documentation

## 2023-03-28 LAB — CBC WITH DIFFERENTIAL/PLATELET
Abs Immature Granulocytes: 0.03 10*3/uL (ref 0.00–0.07)
Basophils Absolute: 0 10*3/uL (ref 0.0–0.1)
Basophils Relative: 1 %
Eosinophils Absolute: 0.3 10*3/uL (ref 0.0–0.5)
Eosinophils Relative: 5 %
HCT: 45.3 % (ref 39.0–52.0)
Hemoglobin: 15.1 g/dL (ref 13.0–17.0)
Immature Granulocytes: 1 %
Lymphocytes Relative: 33 %
Lymphs Abs: 1.8 10*3/uL (ref 0.7–4.0)
MCH: 29.1 pg (ref 26.0–34.0)
MCHC: 33.3 g/dL (ref 30.0–36.0)
MCV: 87.3 fL (ref 80.0–100.0)
Monocytes Absolute: 0.5 10*3/uL (ref 0.1–1.0)
Monocytes Relative: 9 %
Neutro Abs: 2.9 10*3/uL (ref 1.7–7.7)
Neutrophils Relative %: 51 %
Platelets: 262 10*3/uL (ref 150–400)
RBC: 5.19 MIL/uL (ref 4.22–5.81)
RDW: 13.2 % (ref 11.5–15.5)
WBC: 5.5 10*3/uL (ref 4.0–10.5)
nRBC: 0 % (ref 0.0–0.2)

## 2023-03-28 LAB — COMPREHENSIVE METABOLIC PANEL
ALT: 57 U/L — ABNORMAL HIGH (ref 0–44)
AST: 38 U/L (ref 15–41)
Albumin: 4.3 g/dL (ref 3.5–5.0)
Alkaline Phosphatase: 65 U/L (ref 38–126)
Anion gap: 9 (ref 5–15)
BUN: 12 mg/dL (ref 6–20)
CO2: 26 mmol/L (ref 22–32)
Calcium: 9.3 mg/dL (ref 8.9–10.3)
Chloride: 102 mmol/L (ref 98–111)
Creatinine, Ser: 0.99 mg/dL (ref 0.61–1.24)
GFR, Estimated: >60 mL/min (ref >60)
Glucose, Bld: 93 mg/dL (ref 70–99)
Potassium: 4.2 mmol/L (ref 3.5–5.1)
Sodium: 137 mmol/L (ref 135–145)
Total Bilirubin: 0.8 mg/dL (ref 0.0–1.2)
Total Protein: 7.9 g/dL (ref 6.5–8.1)

## 2023-03-28 LAB — HEMOGLOBIN A1C
Hgb A1c MFr Bld: 5.3 % (ref 4.8–5.6)
Mean Plasma Glucose: 105.41 mg/dL

## 2023-03-28 MED ORDER — BIKTARVY 50-200-25 MG PO TABS
1.0000 | ORAL_TABLET | Freq: Every day | ORAL | 8 refills | Status: DC
Start: 2023-03-28 — End: 2023-11-28
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
## 2023-03-29 LAB — T-HELPER CELLS CD4/CD8 %
% CD 4 Pos. Lymph.: 29.3 % — ABNORMAL LOW (ref 30.8–58.5)
Absolute CD 4 Helper: 527 /uL (ref 359–1519)
Basophils Absolute: 0 10*3/uL (ref 0.0–0.2)
Basos: 1 %
CD3+CD4+ Cells/CD3+CD8+ Cells Bld: 0.85 — ABNORMAL LOW (ref 0.92–3.72)
CD3+CD8+ Cells # Bld: 619 /uL (ref 109–897)
CD3+CD8+ Cells NFr Bld: 34.4 % (ref 12.0–35.5)
EOS (ABSOLUTE): 0.2 10*3/uL (ref 0.0–0.4)
Eos: 4 %
Hematocrit: 45.5 % (ref 37.5–51.0)
Hemoglobin: 15.3 g/dL (ref 13.0–17.7)
Immature Grans (Abs): 0 10*3/uL (ref 0.0–0.1)
Immature Granulocytes: 1 %
Lymphocytes Absolute: 1.8 10*3/uL (ref 0.7–3.1)
Lymphs: 34 %
MCH: 29 pg (ref 26.6–33.0)
MCHC: 33.6 g/dL (ref 31.5–35.7)
MCV: 86 fL (ref 79–97)
Monocytes Absolute: 0.5 10*3/uL (ref 0.1–0.9)
Monocytes: 9 %
Neutrophils Absolute: 2.9 10*3/uL (ref 1.4–7.0)
Neutrophils: 51 %
Platelets: 278 10*3/uL (ref 150–450)
RBC: 5.27 x10E6/uL (ref 4.14–5.80)
RDW: 13 % (ref 11.6–15.4)
WBC: 5.5 10*3/uL (ref 3.4–10.8)

## 2023-03-29 LAB — RPR: RPR Ser Ql: NONREACTIVE

## 2023-03-29 LAB — HIV-1 RNA QUANT-NO REFLEX-BLD
HIV 1 RNA Quant: 120 {copies}/mL
LOG10 HIV-1 RNA: 2.079 {Log}

## 2023-04-10 ENCOUNTER — Telehealth: Payer: Self-pay

## 2023-04-10 NOTE — Telephone Encounter (Signed)
-----   Message from [facility] sent at 04/09/2023  3:52 PM EST ----- Please let him know that his Cd 4 is > 500 and much better Vl - blip with 140. Nothing to worry though ----- Message ----- From: Interface, Lab In [place] Sent: 03/28/2023  12:54 PM EST To: Donald Berlin, MD

## 2023-04-10 NOTE — Telephone Encounter (Signed)
Patient advised of lab results and verbalized understanding.  Gary Pennington Gary Pennington, CMA

## 2023-11-28 ENCOUNTER — Ambulatory Visit: Payer: BC Managed Care – PPO | Attending: Infectious Diseases | Admitting: Infectious Diseases

## 2023-11-28 ENCOUNTER — Encounter: Payer: Self-pay | Admitting: Infectious Diseases

## 2023-11-28 ENCOUNTER — Other Ambulatory Visit
Admission: RE | Admit: 2023-11-28 | Discharge: 2023-11-28 | Disposition: A | Discharge status: Home / Self Care | Source: Ambulatory Visit | Attending: Infectious Diseases | Admitting: Infectious Diseases

## 2023-11-28 VITALS — BP 131/79 | HR 57 | Temp 97.9°F | Ht 68.0 in | Wt 265.0 lb

## 2023-11-28 DIAGNOSIS — Z79624 Long term (current) use of inhibitors of nucleotide synthesis: Secondary | ICD-10-CM | POA: Diagnosis not present

## 2023-11-28 DIAGNOSIS — B20 Human immunodeficiency virus [HIV] disease: Secondary | ICD-10-CM | POA: Insufficient documentation

## 2023-11-28 DIAGNOSIS — Z9049 Acquired absence of other specified parts of digestive tract: Secondary | ICD-10-CM | POA: Insufficient documentation

## 2023-11-28 LAB — CBC WITH DIFFERENTIAL/PLATELET
Abs Immature Granulocytes: 0.02 K/uL (ref 0.00–0.07)
Basophils Absolute: 0 K/uL (ref 0.0–0.1)
Basophils Relative: 1 %
Eosinophils Absolute: 0.1 K/uL (ref 0.0–0.5)
Eosinophils Relative: 3 %
HCT: 47 % (ref 39.0–52.0)
Hemoglobin: 15.3 g/dL (ref 13.0–17.0)
Immature Granulocytes: 1 %
Lymphocytes Relative: 41 %
Lymphs Abs: 1.7 K/uL (ref 0.7–4.0)
MCH: 28.5 pg (ref 26.0–34.0)
MCHC: 32.6 g/dL (ref 30.0–36.0)
MCV: 87.5 fL (ref 80.0–100.0)
Monocytes Absolute: 0.4 K/uL (ref 0.1–1.0)
Monocytes Relative: 10 %
Neutro Abs: 1.8 K/uL (ref 1.7–7.7)
Neutrophils Relative %: 44 %
Platelets: 223 K/uL (ref 150–400)
RBC: 5.37 MIL/uL (ref 4.22–5.81)
RDW: 12.9 % (ref 11.5–15.5)
WBC: 4.1 K/uL (ref 4.0–10.5)
nRBC: 0 % (ref 0.0–0.2)

## 2023-11-28 LAB — COMPREHENSIVE METABOLIC PANEL WITH GFR
ALT: 48 U/L — ABNORMAL HIGH (ref 0–44)
AST: 40 U/L (ref 15–41)
Albumin: 3.9 g/dL (ref 3.5–5.0)
Alkaline Phosphatase: 60 U/L (ref 38–126)
Anion gap: 9 (ref 5–15)
BUN: 13 mg/dL (ref 6–20)
CO2: 27 mmol/L (ref 22–32)
Calcium: 9.1 mg/dL (ref 8.9–10.3)
Chloride: 104 mmol/L (ref 98–111)
Creatinine, Ser: 1 mg/dL (ref 0.61–1.24)
GFR, Estimated: >60 mL/min (ref >60)
Glucose, Bld: 84 mg/dL (ref 70–99)
Potassium: 3.8 mmol/L (ref 3.5–5.1)
Sodium: 140 mmol/L (ref 135–145)
Total Bilirubin: 0.9 mg/dL (ref 0.0–1.2)
Total Protein: 7.2 g/dL (ref 6.5–8.1)

## 2023-11-28 MED ORDER — BIKTARVY 50-200-25 MG PO TABS
1.0000 | ORAL_TABLET | Freq: Every day | ORAL | 8 refills | Status: AC
Start: 2023-11-28 — End: ?

## 2023-11-28 NOTE — Progress Notes (Signed)
 NAME: Gary Pennington  DOB: 01/24/1991  MRN: [id number]  Date/Time: 11/28/2023 9:48 AM   Subjective:  Here for follow up-for AIDS last seen Jan 2025 - Gary Pennington is a 33 y.o. diagnosed with HIV on  09/19/21 on a routine test in the hospital  HE  started Biktarvy  in Aug 2023 Doing well 100% adherent  Last Vl 120 and cd4 is 595 After losing 40 pounds he is now stagnated at 260 pounds Wife neg Has a 2 yr old child   HIV diagnosed 09/19/21 Nadir Cd4 153 VL 285K OI -none HAARt history- naive Acquired thru Denies IVDA/MSM/no Blood transfusion Thinks it could be from collecting trash and possibly getting stuck with needles Genotype- NA Denies IVDA/MSM/no Blood transfusion Thinks it could be from collecting trash and possibly getting stuck with needles HE is in a monogamous relationship with his wife of 6  years and they have a 27 month old girl He told his wife and his aprents and her parents Wife tested negative   PMH Fracture rt arm Trauma to his fingers- sheared off- and sewn together  PSH Tonsillectomy Past Surgical History:  Procedure Laterality Date   XI ROBOTIC LAPAROSCOPIC ASSISTED APPENDECTOMY N/A 09/19/2021   Procedure: XI ROBOTIC LAPAROSCOPIC ASSISTED APPENDECTOMY;  Surgeon: Rodolph Romano, MD;  Location: [facility] ORS;  Service: General;  Laterality: N/A;    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 Current Outpatient Medications  Medication Sig Dispense Refill   bictegravir-emtricitabine-tenofovir AF (BIKTARVY ) 50-200-25 MG TABS tablet Take 1 tablet by mouth daily. 30 tablet 8   No current facility-administered medications for this visit.    REVIEW OF SYSTEMS:  Const: negative fever, negative chills, negative weight loss Eyes: negative diplopia or visual changes, negative eye pain ENT: negative coryza, negative sore  throat Resp: negative cough, hemoptysis, dyspnea Cards: negative for chest pain, palpitations, lower extremity edema GU: negative for frequency, dysuria and hematuria Skin: negative for rash and pruritus Heme: negative for easy bruising and gum/nose bleeding MS: negative for myalgias, arthralgias, back pain and muscle weakness Neurolo:negative for headaches, dizziness, vertigo, memory problems  Psych: negative for feelings of anxiety, depression   Objective:  VITALS:  BP 131/79   Pulse (!) 57   Temp 97.9 F (36.6 C) (Temporal)   Ht 5' 8 (1.727 m)   Wt 265 lb (120.2 kg)   SpO2 98%   BMI 40.29 kg/m  PHYSICAL EXAM:  General: Alert, cooperative, no distress, appears stated age.  Increased BMI Head: Normocephalic, without obvious abnormality, atraumatic. Eyes: Conjunctivae clear, anicteric sclerae. Pupils are equal Nose: Nares normal. No drainage or sinus tenderness. Throat: Lips, mucosa, and tongue normal. No Thrush Neck: Supple, symmetrical, no adenopathy, thyroid: non tender no carotid bruit and no JVD. Back: No CVA tenderness. Lungs: Clear to auscultation bilaterally. No Wheezing or Rhonchi. No rales. Heart: Regular rate and rhythm, no murmur, rub or gallop. Extremities: Extremities normal, atraumatic, no cyanosis. No edema. No clubbing Skin: No rashes or lesions. Not Jaundiced Lymph: Cervical, supraclavicular normal. Neurologic: Grossly non-focal    IMAGING RESULTS: Health maintenance Vaccination  Vaccine Date last given comment  Influenza    Hepatitis B 1990-06-05, 04/22/1992, 08/12/1992 03/15/22   Hepatitis A    Prevnar-PCV-20 03/15/22   Pneumovac-PPSV-23    TdaP 01/26/20   HPV    Shingrix ( zoster vaccine)  ______________________  Labs Lab Result  Date comment  HIV VL 30 09/25/22   CD4 324 (24%) 7/23   Genotype NO R 7/27   [id number]     HIV antibody     RPR NR 1/24   Quantiferon Gold NR    Hep C ab NR    Hepatitis B-ab,ag,c     Hepatitis A-IgM, IgG  /T     Lipid     GC/CHL     PAP     HB,PLT,Cr, LFT       Preventive  Procedure Result  Date comment  colonoscopy          Dental exam Teeth extraction  4 wisdom teeth  Opthal       Impression/Recommendation AIDS/?-on Biktarvy - Vl 120  and Cd4 is 529 100% adherent No side effects Today will do HV RNA, CD4   Wife aware of his status and she is negative ? h/o appendectomy Health maintenance  updated A1c sent  ____________________________________________ Follow up 7 months

## 2023-11-29 LAB — HIV-1 RNA QUANT-NO REFLEX-BLD
HIV 1 RNA Quant: 90 {copies}/mL
LOG10 HIV-1 RNA: 1.954 {Log_copies}/mL

## 2023-12-13 ENCOUNTER — Ambulatory Visit: Payer: Self-pay | Admitting: Infectious Diseases

## 2024-06-16 ENCOUNTER — Ambulatory Visit: Admitting: Infectious Diseases
